# Patient Record
Sex: Female | Born: 1985 | Race: White | Hispanic: No | Marital: Married | Smoking: Never smoker
Health system: Southern US, Community
[De-identification: ages and names within clinical notes are randomized; demographics above are authoritative.]

## PROBLEM LIST (undated history)

## (undated) DIAGNOSIS — F329 Major depressive disorder, single episode, unspecified: Secondary | ICD-10-CM

## (undated) DIAGNOSIS — F32A Depression, unspecified: Secondary | ICD-10-CM

## (undated) DIAGNOSIS — Z8619 Personal history of other infectious and parasitic diseases: Secondary | ICD-10-CM

## (undated) HISTORY — DX: Personal history of other infectious and parasitic diseases: Z86.19

## (undated) HISTORY — PX: BREAST SURGERY: SHX581

## (undated) HISTORY — DX: Depression, unspecified: F32.A

## (undated) HISTORY — PX: TONSILLECTOMY: SUR1361

## (undated) HISTORY — DX: Major depressive disorder, single episode, unspecified: F32.9

---
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
## 2006-03-25 ENCOUNTER — Other Ambulatory Visit: Admission: RE | Admit: 2006-03-25 | Discharge: 2006-03-25 | Payer: Self-pay | Admitting: Internal Medicine

## 2007-04-20 ENCOUNTER — Other Ambulatory Visit: Admission: RE | Admit: 2007-04-20 | Discharge: 2007-04-20 | Payer: Self-pay | Admitting: Family Medicine

## 2008-07-16 ENCOUNTER — Other Ambulatory Visit: Admission: RE | Admit: 2008-07-16 | Discharge: 2008-07-16 | Payer: Self-pay | Admitting: Family Medicine

## 2009-08-14 ENCOUNTER — Other Ambulatory Visit: Admission: RE | Admit: 2009-08-14 | Discharge: 2009-08-14 | Payer: Self-pay | Admitting: Family Medicine

## 2010-07-13 ENCOUNTER — Other Ambulatory Visit: Admission: RE | Admit: 2010-07-13 | Discharge: 2010-07-13 | Payer: Self-pay | Admitting: Family Medicine

## 2011-04-13 LAB — OB RESULTS CONSOLE GBS: GBS: NEGATIVE

## 2011-07-15 ENCOUNTER — Other Ambulatory Visit (HOSPITAL_COMMUNITY)
Admission: RE | Admit: 2011-07-15 | Discharge: 2011-07-15 | Disposition: A | Discharge status: Home / Self Care | Payer: BC Managed Care – PPO | Source: Ambulatory Visit | Attending: Family Medicine | Admitting: Family Medicine

## 2011-07-15 ENCOUNTER — Other Ambulatory Visit: Payer: Self-pay | Admitting: Family Medicine

## 2011-07-15 DIAGNOSIS — Z0000 Encounter for general adult medical examination without abnormal findings: Secondary | ICD-10-CM | POA: Insufficient documentation

## 2011-09-17 LAB — OB RESULTS CONSOLE GC/CHLAMYDIA: Chlamydia: NEGATIVE

## 2011-09-17 LAB — OB RESULTS CONSOLE RPR: RPR: NONREACTIVE

## 2011-09-17 LAB — OB RESULTS CONSOLE ANTIBODY SCREEN: Antibody Screen: NEGATIVE

## 2012-04-12 LAB — OB RESULTS CONSOLE GBS: GBS: NEGATIVE

## 2012-05-05 ENCOUNTER — Encounter (HOSPITAL_COMMUNITY): Payer: Self-pay | Admitting: *Deleted

## 2012-05-05 ENCOUNTER — Telehealth (HOSPITAL_COMMUNITY): Payer: Self-pay | Admitting: *Deleted

## 2012-05-05 NOTE — Telephone Encounter (Signed)
Preadmission screen  

## 2012-05-06 ENCOUNTER — Encounter (HOSPITAL_COMMUNITY): Payer: Self-pay | Admitting: Anesthesiology

## 2012-05-06 ENCOUNTER — Inpatient Hospital Stay (HOSPITAL_COMMUNITY)
Admission: AD | Admit: 2012-05-06 | Discharge: 2012-05-08 | DRG: 373 | Disposition: A | Discharge status: Home / Self Care | Payer: BC Managed Care – PPO | Source: Ambulatory Visit | Attending: Obstetrics and Gynecology | Admitting: Obstetrics and Gynecology

## 2012-05-06 ENCOUNTER — Encounter (HOSPITAL_COMMUNITY): Payer: Self-pay | Admitting: *Deleted

## 2012-05-06 ENCOUNTER — Inpatient Hospital Stay (HOSPITAL_COMMUNITY): Payer: BC Managed Care – PPO | Admitting: Anesthesiology

## 2012-05-06 ENCOUNTER — Encounter (HOSPITAL_COMMUNITY): Payer: Self-pay | Admitting: Obstetrics and Gynecology

## 2012-05-06 LAB — PROTIME-INR
INR: 0.86 (ref 0.00–1.49)
Prothrombin Time: 11.9 seconds (ref 11.6–15.2)

## 2012-05-06 LAB — COMPREHENSIVE METABOLIC PANEL
Alkaline Phosphatase: 199 U/L — ABNORMAL HIGH (ref 39–117)
BUN: 7 mg/dL (ref 6–23)
Chloride: 99 mEq/L (ref 96–112)
GFR calc Af Amer: >90 mL/min (ref >90)
GFR calc non Af Amer: >90 mL/min (ref >90)
Glucose, Bld: 76 mg/dL (ref 70–99)
Potassium: 3.6 mEq/L (ref 3.5–5.1)
Total Bilirubin: 0.2 mg/dL — ABNORMAL LOW (ref 0.3–1.2)
Total Protein: 6.8 g/dL (ref 6.0–8.3)

## 2012-05-06 LAB — CBC
MCH: 30.1 pg (ref 26.0–34.0)
MCV: 88.6 fL (ref 78.0–100.0)
Platelets: 134 10*3/uL — ABNORMAL LOW (ref 150–400)
Platelets: 121 10*3/uL — ABNORMAL LOW (ref 150–400)
RDW: 13.1 % (ref 11.5–15.5)
RDW: 12.9 % (ref 11.5–15.5)
WBC: 17.6 10*3/uL — ABNORMAL HIGH (ref 4.0–10.5)

## 2012-05-06 LAB — APTT: aPTT: 25 seconds (ref 24–37)

## 2012-05-06 MED ORDER — BENZOCAINE-MENTHOL 20-0.5 % EX AERO
1.0000 "application " | INHALATION_SPRAY | CUTANEOUS | Status: DC | PRN
  Filled 2012-05-06: qty 56

## 2012-05-06 MED ORDER — OXYTOCIN 20 UNITS IN LACTATED RINGERS INFUSION - SIMPLE
125.0000 mL/h | Freq: Once | INTRAVENOUS | Status: AC
  Administered 2012-05-06: 125 mL/h via INTRAVENOUS

## 2012-05-06 MED ORDER — IBUPROFEN 600 MG PO TABS
600.0000 mg | ORAL_TABLET | Freq: Four times a day (QID) | ORAL | Status: DC
  Administered 2012-05-06 – 2012-05-08 (×6): 600 mg via ORAL
  Filled 2012-05-06 (×6): qty 1

## 2012-05-06 MED ORDER — BUTORPHANOL TARTRATE 2 MG/ML IJ SOLN: 1.0000 mg | INTRAMUSCULAR | Status: DC | PRN

## 2012-05-06 MED ORDER — PRENATAL MULTIVITAMIN CH
1.0000 | ORAL_TABLET | Freq: Every day | ORAL | Status: DC
  Administered 2012-05-07 – 2012-05-08 (×2): 1 via ORAL
  Filled 2012-05-06 (×2): qty 1

## 2012-05-06 MED ORDER — LACTATED RINGERS IV SOLN
INTRAVENOUS | Status: DC
  Administered 2012-05-06: 09:00:00 via INTRAVENOUS

## 2012-05-06 MED ORDER — ACETAMINOPHEN 325 MG PO TABS: 650.0000 mg | ORAL_TABLET | ORAL | Status: DC | PRN

## 2012-05-06 MED ORDER — MEASLES, MUMPS & RUBELLA VAC ~~LOC~~ INJ: 0.5000 mL | INJECTION | Freq: Once | SUBCUTANEOUS | Status: DC

## 2012-05-06 MED ORDER — OXYCODONE-ACETAMINOPHEN 5-325 MG PO TABS: 1.0000 | ORAL_TABLET | ORAL | Status: DC | PRN

## 2012-05-06 MED ORDER — PHENYLEPHRINE 40 MCG/ML (10ML) SYRINGE FOR IV PUSH (FOR BLOOD PRESSURE SUPPORT)
80.0000 ug | PREFILLED_SYRINGE | INTRAVENOUS | Status: DC | PRN
  Filled 2012-05-06: qty 5

## 2012-05-06 MED ORDER — LANOLIN HYDROUS EX OINT: TOPICAL_OINTMENT | CUTANEOUS | Status: DC | PRN

## 2012-05-06 MED ORDER — EPHEDRINE 5 MG/ML INJ
10.0000 mg | INTRAVENOUS | Status: DC | PRN
  Filled 2012-05-06: qty 4

## 2012-05-06 MED ORDER — ONDANSETRON HCL 4 MG/2ML IJ SOLN: 4.0000 mg | INTRAMUSCULAR | Status: DC | PRN

## 2012-05-06 MED ORDER — SIMETHICONE 80 MG PO CHEW: 80.0000 mg | CHEWABLE_TABLET | ORAL | Status: DC | PRN

## 2012-05-06 MED ORDER — ZOLPIDEM TARTRATE 5 MG PO TABS: 5.0000 mg | ORAL_TABLET | Freq: Every evening | ORAL | Status: DC | PRN

## 2012-05-06 MED ORDER — FENTANYL 2.5 MCG/ML BUPIVACAINE 1/10 % EPIDURAL INFUSION (WH - ANES)
14.0000 mL/h | INTRAMUSCULAR | Status: DC
  Administered 2012-05-06: 14 mL/h via EPIDURAL
  Filled 2012-05-06 (×2): qty 60

## 2012-05-06 MED ORDER — FENTANYL 2.5 MCG/ML BUPIVACAINE 1/10 % EPIDURAL INFUSION (WH - ANES)
INTRAMUSCULAR | Status: DC | PRN
  Administered 2012-05-06: 14 mL/h via EPIDURAL

## 2012-05-06 MED ORDER — DIPHENHYDRAMINE HCL 50 MG/ML IJ SOLN: 12.5000 mg | INTRAMUSCULAR | Status: DC | PRN

## 2012-05-06 MED ORDER — TETANUS-DIPHTH-ACELL PERTUSSIS 5-2.5-18.5 LF-MCG/0.5 IM SUSP: 0.5000 mL | Freq: Once | INTRAMUSCULAR | Status: DC

## 2012-05-06 MED ORDER — ONDANSETRON HCL 4 MG/2ML IJ SOLN: 4.0000 mg | Freq: Four times a day (QID) | INTRAMUSCULAR | Status: DC | PRN

## 2012-05-06 MED ORDER — EPHEDRINE 5 MG/ML INJ: 10.0000 mg | INTRAVENOUS | Status: DC | PRN

## 2012-05-06 MED ORDER — CITRIC ACID-SODIUM CITRATE 334-500 MG/5ML PO SOLN: 30.0000 mL | ORAL | Status: DC | PRN

## 2012-05-06 MED ORDER — LACTATED RINGERS IV SOLN: 500.0000 mL | Freq: Once | INTRAVENOUS | Status: DC

## 2012-05-06 MED ORDER — DIPHENHYDRAMINE HCL 25 MG PO CAPS: 25.0000 mg | ORAL_CAPSULE | Freq: Four times a day (QID) | ORAL | Status: DC | PRN

## 2012-05-06 MED ORDER — FLEET ENEMA 7-19 GM/118ML RE ENEM: 1.0000 | ENEMA | RECTAL | Status: DC | PRN

## 2012-05-06 MED ORDER — SENNOSIDES-DOCUSATE SODIUM 8.6-50 MG PO TABS
2.0000 | ORAL_TABLET | Freq: Every day | ORAL | Status: DC
  Administered 2012-05-06 – 2012-05-07 (×2): 2 via ORAL

## 2012-05-06 MED ORDER — MEDROXYPROGESTERONE ACETATE 150 MG/ML IM SUSP: 150.0000 mg | INTRAMUSCULAR | Status: DC | PRN

## 2012-05-06 MED ORDER — ONDANSETRON HCL 4 MG PO TABS: 4.0000 mg | ORAL_TABLET | ORAL | Status: DC | PRN

## 2012-05-06 MED ORDER — LIDOCAINE HCL (PF) 1 % IJ SOLN
INTRAMUSCULAR | Status: DC | PRN
  Administered 2012-05-06: 8 mL
  Administered 2012-05-06: 9 mL

## 2012-05-06 MED ORDER — IBUPROFEN 600 MG PO TABS
600.0000 mg | ORAL_TABLET | Freq: Four times a day (QID) | ORAL | Status: DC | PRN
  Administered 2012-05-06: 600 mg via ORAL
  Filled 2012-05-06: qty 1

## 2012-05-06 MED ORDER — OXYTOCIN BOLUS FROM INFUSION
500.0000 mL | Freq: Once | INTRAVENOUS | Status: DC
  Filled 2012-05-06: qty 500
  Filled 2012-05-06: qty 1000

## 2012-05-06 MED ORDER — PHENYLEPHRINE 40 MCG/ML (10ML) SYRINGE FOR IV PUSH (FOR BLOOD PRESSURE SUPPORT): 80.0000 ug | PREFILLED_SYRINGE | INTRAVENOUS | Status: DC | PRN

## 2012-05-06 MED ORDER — LIDOCAINE HCL (PF) 1 % IJ SOLN
30.0000 mL | INTRAMUSCULAR | Status: DC | PRN
  Filled 2012-05-06: qty 30

## 2012-05-06 MED ORDER — DIBUCAINE 1 % RE OINT: 1.0000 "application " | TOPICAL_OINTMENT | RECTAL | Status: DC | PRN

## 2012-05-06 MED ORDER — LACTATED RINGERS IV SOLN: 500.0000 mL | INTRAVENOUS | Status: DC | PRN

## 2012-05-06 MED ORDER — WITCH HAZEL-GLYCERIN EX PADS: 1.0000 "application " | MEDICATED_PAD | CUTANEOUS | Status: DC | PRN

## 2012-05-06 NOTE — Progress Notes (Signed)
Dr. Chilton Si notified of patient arrival in labor. Contractions every 2 mins. Cervix 5,70,-2 bulging bag. Pt wanting an epidural. Orders received to discharge patient home.

## 2012-05-06 NOTE — H&P (Signed)
Emma Coleman is a 26 y.o. female presenting  At term in labor. Prenatal course has been uncomplicated.   Past Medical History  Diagnosis Date  . Depression   . History of chicken pox    Past Surgical History  Procedure Date  . Breast surgery     implants   Family History: family history includes ALS in her maternal uncle; Cancer in her father, paternal grandfather, and paternal grandmother; Depression in her father and mother; Heart attack in her father and maternal grandfather; Hyperlipidemia in her father, maternal grandmother, and mother; and Hypertension in her father and mother. Social History:  reports that she has never smoked. She has never used smokeless tobacco. She reports that she does not drink alcohol or use illicit drugs.  ROS noncontributory  Dilation: 10 Effacement (%): 100 Station: +2 Exam by:: dr Neva Seat Blood pressure 124/84, pulse 74, temperature 98.6 F (37 C), temperature source Oral, resp. rate 18, height 5\' 4"  (1.626 m), weight 72.122 kg (159 lb), last menstrual period 07/29/2011, SpO2 99.00%, unknown if currently breastfeeding. Phys Exam:  HEENT nl Chest clear Heart S1and S2 clear abd term gravida Ext nl cx C/C/+1  ( 5 cm on admission) Prenatal labs: ABO, Rh: B/Positive/-- (10/19 0000) Antibody: Negative (10/19 0000) Rubella:   RPR: NON REACTIVE (06/08 0850)  HBsAg: Negative (10/19 0000)  HIV: Non-reactive (10/19 0000)  GBS: Negative (05/15 0000)   Assessment/Plan: Term pregnancy, labor  Expectant care   Emma Coleman E 05/06/2012, 5:54 PM

## 2012-05-06 NOTE — Anesthesia Preprocedure Evaluation (Signed)
Anesthesia Evaluation  Patient identified by MRN, date of birth, ID band Patient awake    Reviewed: Allergy & Precautions, H&P , NPO status , Patient's Chart, lab work & pertinent test results  Airway Mallampati: I TM Distance: >3 FB Neck ROM: full    Dental No notable dental hx.    Pulmonary neg pulmonary ROS,    Pulmonary exam normal       Cardiovascular negative cardio ROS      Neuro/Psych PSYCHIATRIC DISORDERS Depression negative neurological ROS     GI/Hepatic negative GI ROS, Neg liver ROS,   Endo/Other  negative endocrine ROS  Renal/GU negative Renal ROS  negative genitourinary   Musculoskeletal negative musculoskeletal ROS (+)   Abdominal Normal abdominal exam  (+)   Peds negative pediatric ROS (+)  Hematology negative hematology ROS (+)   Anesthesia Other Findings   Reproductive/Obstetrics (+) Pregnancy                           Anesthesia Physical Anesthesia Plan  ASA: II  Anesthesia Plan: Epidural   Post-op Pain Management:    Induction:   Airway Management Planned:   Additional Equipment:   Intra-op Plan:   Post-operative Plan:   Informed Consent: I have reviewed the patients History and Physical, chart, labs and discussed the procedure including the risks, benefits and alternatives for the proposed anesthesia with the patient or authorized representative who has indicated his/her understanding and acceptance.     Plan Discussed with:   Anesthesia Plan Comments:         Anesthesia Quick Evaluation  

## 2012-05-06 NOTE — Anesthesia Procedure Notes (Signed)
Epidural Patient location during procedure: OB Start time: 05/06/2012 10:20 AM End time: 05/06/2012 10:26 AM Reason for block: procedure for pain  Staffing Anesthesiologist: Sandrea Hughs Performed by: anesthesiologist   Preanesthetic Checklist Completed: patient identified, site marked, surgical consent, pre-op evaluation, timeout performed, IV checked, risks and benefits discussed and monitors and equipment checked  Epidural Patient position: sitting Prep: site prepped and draped and DuraPrep Patient monitoring: continuous pulse ox and blood pressure Approach: midline Injection technique: LOR air  Needle:  Needle type: Tuohy  Needle gauge: 17 G Needle length: 9 cm Needle insertion depth: 6 cm Catheter type: closed end flexible Catheter size: 19 Gauge Catheter at skin depth: 11 cm Test dose: negative and Other  Assessment Sensory level: T10 Events: blood not aspirated, injection not painful, no injection resistance, negative IV test and no paresthesia

## 2012-05-06 NOTE — Progress Notes (Signed)
Delivery Note At 3:16 PM a viable female was delivered via Vaginal, Spontaneous Delivery (Presentation: ; Occiput Anterior).  APGAR: 9, 9; weight 6 lb 7.9 oz (2945 g).  Nuchal cord x1 Placenta status: Intact, Spontaneous.  Cord: 3 vessels with the following complications: None.  Cord pH: na  Anesthesia: Epidural  Episiotomy: Median Lacerations: 2nd degree Suture Repair: 2.0 chromic Est. Blood Loss (mL): 500  Mom to postpartum.  Baby to nursery-stable.  Reshaun Briseno E 05/06/2012, 6:00 PM

## 2012-05-06 NOTE — MAU Note (Signed)
Pt presents to MAU with chief complaint of contractions. Pt is [redacted]w[redacted]d; G1 was seen in the office yesterday and her cervix was 4 cm. Pt says contractions started this morning at 0430 and have been consistant throughout the morning.

## 2012-05-07 LAB — CBC
MCHC: 33.1 g/dL (ref 30.0–36.0)
MCV: 89.3 fL (ref 78.0–100.0)
Platelets: 106 10*3/uL — ABNORMAL LOW (ref 150–400)
RDW: 13.2 % (ref 11.5–15.5)
WBC: 17.4 10*3/uL — ABNORMAL HIGH (ref 4.0–10.5)

## 2012-05-07 MED ORDER — FERROUS SULFATE 300 (60 FE) MG/5ML PO SYRP
300.0000 mg | ORAL_SOLUTION | Freq: Two times a day (BID) | ORAL | Status: DC
  Filled 2012-05-07: qty 5

## 2012-05-07 NOTE — Progress Notes (Signed)
Patient was referred for history of depression/anxiety. Referral screened out by Clinical Social Worker because prenatal record indicates depression occurred around age [age] years of age.  Please contact the Clinical Social Worker if needs arise, or by the patient's request. Staci Acosta, MSW LCSW, 05/07/2012, 8:17 am

## 2012-05-07 NOTE — Progress Notes (Signed)
Post Partum Day 1   Subjective: no complaints.   No signs, symptoms of PIH  Objective: Blood pressure 127/84, pulse 93, temperature 98.2 F (36.8 C), temperature source Oral, resp. rate 18, height 5\' 4"  (1.626 m), weight 72.122 kg (159 lb), last menstrual period 07/29/2011, SpO2 99.00%, unknown if currently breastfeeding. Admission BPs were slightly elevated, but normal now. Platelets on admission 134,000.  106,000 today.  Other PIH labs were wnl 05/06/12 except  Alk phos elevated at 199.  Physical Exam:  General: alert, cooperative and no distress Lochia: appropriate Uterine Fundus: firm Episiotomy, laceration : no significant drainage DVT Evaluation: No evidence of DVT seen on physical exam.   Basename 05/07/12 0601 05/06/12 1410  HGB 10.2* 13.6  HCT 30.8* 40.3    Assessment/Plan: Plan for discharge tomorrow Iron supplementation   LOS: 1 day   Radek Carnero E 05/07/2012, 8:52 PM

## 2012-05-07 NOTE — Anesthesia Postprocedure Evaluation (Signed)
Anesthesia Post Note  Patient: Emma Coleman  Procedure(s) Performed: * No procedures listed *  Anesthesia type: Epidural  Patient location: Mother/Baby  Post pain: Pain level controlled  Post assessment: Post-op Vital signs reviewed  Last Vitals:  Filed Vitals:   05/07/12 1452  BP: 127/84  Pulse: 93  Temp: 36.8 C  Resp: 18    Post vital signs: Reviewed  Level of consciousness: awake  Complications: No apparent anesthesia complications

## 2012-05-07 NOTE — Addendum Note (Signed)
Addendum  created 05/07/12 2051 by Sandrea Hughs., MD   Modules edited:Notes Section

## 2012-05-07 NOTE — Anesthesia Postprocedure Evaluation (Deleted)
  Anesthesia Post-op Note  Patient: Emma Coleman  Procedure(s) Performed: * No procedures listed *  Patient Location: Mother/Baby  Anesthesia Type: Epidural  Level of Consciousness: awake, alert  and oriented  Airway and Oxygen Therapy: Patient Spontanous Breathing  Post-op Pain: mild  Post-op Assessment: Patient's Cardiovascular Status Stable and Respiratory Function Stable  Post-op Vital Signs: stable  Complications: No apparent anesthesia complications

## 2012-05-08 MED ORDER — OXYCODONE-ACETAMINOPHEN 5-325 MG PO TABS: 1.0000 | ORAL_TABLET | Freq: Four times a day (QID) | ORAL | Status: AC | PRN

## 2012-05-08 MED ORDER — IBUPROFEN 600 MG PO TABS: 600.0000 mg | ORAL_TABLET | Freq: Four times a day (QID) | ORAL | Status: AC | PRN

## 2012-05-08 NOTE — Discharge Summary (Signed)
Obstetric Discharge Summary Reason for Admission: onset of labor Prenatal Procedures: none Intrapartum Procedures: spontaneous vaginal delivery Postpartum Procedures: none Complications-Operative and Postpartum: 2nd degree perineal laceration Hemoglobin  Date Value Range Status  05/07/2012 10.2* 12.0-15.0 (g/dL) Final     DELTA CHECK NOTED     REPEATED TO VERIFY     HCT  Date Value Range Status  05/07/2012 30.8* 36.0-46.0 (%) Final    Physical Exam:  General: alert and cooperative Lochia: appropriate Uterine Fundus: firm Incision: NA DVT Evaluation: No evidence of DVT seen on physical exam.  Discharge Diagnoses: Term Pregnancy-delivered  Discharge Information: Date: 05/08/2012 Activity: pelvic rest Diet: routine Medications: PNV, Ibuprofen and Percocet Condition: stable Instructions: refer to practice specific booklet Discharge to: home Follow-up Information    Follow up with Emma Hebenstreit J., MD in 2 weeks. (postpartum visit )    Contact information:   [address] [place] [address] [phone]          Newborn Data: Live born female  Birth Weight: 6 lb 7.9 oz (2945 g) APGAR: 9, 9  Home with mother.  Emma Campas J. 05/08/2012, 8:20 AM

## 2012-05-08 NOTE — Progress Notes (Signed)
Post Partum Day 1 s/p vaginal delivery  Subjective: no complaints, up ad lib, voiding and tolerating PO  Objective: Blood pressure 127/86, pulse 74, temperature 98.1 F (36.7 C), temperature source Oral, resp. rate 18, height 5\' 4"  (1.626 m), weight 72.122 kg (159 lb), last menstrual period 07/29/2011, SpO2 99.00%, unknown if currently breastfeeding.  Physical Exam:  General: alert and cooperative Lochia: appropriate Uterine Fundus: firm Incision: NA DVT Evaluation: No evidence of DVT seen on physical exam.   Basename 05/07/12 0601 05/06/12 1410  HGB 10.2* 13.6  HCT 30.8* 40.3    Assessment/Plan: Discharge home, Breastfeeding and Lactation consult   LOS: 2 days   Nickoli Bagheri J. 05/08/2012, 8:17 AM

## 2012-05-15 ENCOUNTER — Inpatient Hospital Stay (HOSPITAL_COMMUNITY): Admission: RE | Admit: 2012-05-15 | Payer: BC Managed Care – PPO | Source: Ambulatory Visit

## 2012-05-16 ENCOUNTER — Ambulatory Visit (HOSPITAL_COMMUNITY): Payer: BC Managed Care – PPO

## 2012-11-10 ENCOUNTER — Other Ambulatory Visit: Payer: Self-pay | Admitting: Otolaryngology

## 2013-02-05 ENCOUNTER — Other Ambulatory Visit: Payer: Self-pay | Admitting: Obstetrics and Gynecology

## 2013-02-05 ENCOUNTER — Other Ambulatory Visit (HOSPITAL_COMMUNITY)
Admission: RE | Admit: 2013-02-05 | Discharge: 2013-02-05 | Disposition: A | Discharge status: Home / Self Care | Payer: BC Managed Care – PPO | Source: Ambulatory Visit | Attending: Obstetrics and Gynecology | Admitting: Obstetrics and Gynecology

## 2013-02-05 DIAGNOSIS — Z01419 Encounter for gynecological examination (general) (routine) without abnormal findings: Secondary | ICD-10-CM | POA: Insufficient documentation

## 2013-02-05 DIAGNOSIS — Z1151 Encounter for screening for human papillomavirus (HPV): Secondary | ICD-10-CM | POA: Insufficient documentation

## 2013-04-09 ENCOUNTER — Other Ambulatory Visit: Payer: Self-pay | Admitting: Dermatology

## 2014-07-24 ENCOUNTER — Other Ambulatory Visit: Payer: Self-pay | Admitting: Obstetrics and Gynecology

## 2014-07-24 ENCOUNTER — Other Ambulatory Visit (HOSPITAL_COMMUNITY)
Admission: RE | Admit: 2014-07-24 | Discharge: 2014-07-24 | Disposition: A | Discharge status: Home / Self Care | Payer: BC Managed Care – PPO | Source: Ambulatory Visit | Attending: Obstetrics and Gynecology | Admitting: Obstetrics and Gynecology

## 2014-07-24 DIAGNOSIS — Z01419 Encounter for gynecological examination (general) (routine) without abnormal findings: Secondary | ICD-10-CM | POA: Insufficient documentation

## 2014-07-26 LAB — CYTOLOGY - PAP

## 2014-09-30 ENCOUNTER — Encounter (HOSPITAL_COMMUNITY): Payer: Self-pay | Admitting: *Deleted

## 2014-11-29 NOTE — L&D Delivery Note (Signed)
Delivery Note At 7:47 AM a viable female was delivered via Vaginal, Spontaneous Delivery (Presentation: ; Occiput Anterior).  APGAR: 9, 9; weight 7 lb 5.5 oz (3331 g).   Placenta status: Intact, Spontaneous.  Cord: 3 vessels with the following complications: None.  Anesthesia: Pudendal  Episiotomy: None Lacerations: 3rd degree Suture Repair: 2.0 3.0 vicryl Est. Blood Loss (mL): 375  Mom to postpartum.  Baby to Couplet care / Skin to Skin.  Myna HidalgoZAN, Hadden Steig, M 09/25/2015, 9:07 AM

## 2015-02-03 LAB — OB RESULTS CONSOLE RPR: RPR: NONREACTIVE

## 2015-02-03 LAB — OB RESULTS CONSOLE ABO/RH: RH Type: POSITIVE

## 2015-02-03 LAB — OB RESULTS CONSOLE RUBELLA ANTIBODY, IGM: Rubella: IMMUNE

## 2015-02-03 LAB — OB RESULTS CONSOLE ANTIBODY SCREEN: Antibody Screen: NEGATIVE

## 2015-02-03 LAB — OB RESULTS CONSOLE HEPATITIS B SURFACE ANTIGEN: Hepatitis B Surface Ag: NEGATIVE

## 2015-02-03 LAB — OB RESULTS CONSOLE HIV ANTIBODY (ROUTINE TESTING): HIV: NONREACTIVE

## 2015-02-25 LAB — OB RESULTS CONSOLE GC/CHLAMYDIA
Chlamydia: NEGATIVE
GC PROBE AMP, GENITAL: NEGATIVE

## 2015-09-25 ENCOUNTER — Inpatient Hospital Stay (HOSPITAL_COMMUNITY)
Admission: AD | Admit: 2015-09-25 | Discharge: 2015-09-26 | DRG: 775 | Disposition: A | Discharge status: Home / Self Care | Payer: BLUE CROSS/BLUE SHIELD | Source: Ambulatory Visit | Attending: Obstetrics and Gynecology | Admitting: Obstetrics and Gynecology

## 2015-09-25 ENCOUNTER — Encounter (HOSPITAL_COMMUNITY): Payer: Self-pay

## 2015-09-25 DIAGNOSIS — Z3A39 39 weeks gestation of pregnancy: Secondary | ICD-10-CM | POA: Diagnosis not present

## 2015-09-25 DIAGNOSIS — O26893 Other specified pregnancy related conditions, third trimester: Secondary | ICD-10-CM | POA: Diagnosis present

## 2015-09-25 LAB — TYPE AND SCREEN
ABO/RH(D): B POS
Antibody Screen: NEGATIVE

## 2015-09-25 LAB — CBC
HEMATOCRIT: 35.1 % — AB (ref 36.0–46.0)
HEMOGLOBIN: 12.1 g/dL (ref 12.0–15.0)
MCH: 30.6 pg (ref 26.0–34.0)
MCHC: 34.5 g/dL (ref 30.0–36.0)
MCV: 88.6 fL (ref 78.0–100.0)
Platelets: 131 10*3/uL — ABNORMAL LOW (ref 150–400)
RBC: 3.96 MIL/uL (ref 3.87–5.11)
RDW: 13.6 % (ref 11.5–15.5)
WBC: 13.1 10*3/uL — ABNORMAL HIGH (ref 4.0–10.5)

## 2015-09-25 LAB — ABO/RH: ABO/RH(D): B POS

## 2015-09-25 LAB — RPR: RPR Ser Ql: NONREACTIVE

## 2015-09-25 LAB — POCT FERN TEST: POCT Fern Test: POSITIVE

## 2015-09-25 MED ORDER — ONDANSETRON HCL 4 MG/2ML IJ SOLN: 4.0000 mg | INTRAMUSCULAR | Status: DC | PRN

## 2015-09-25 MED ORDER — LACTATED RINGERS IV SOLN: 500.0000 mL | INTRAVENOUS | Status: DC | PRN

## 2015-09-25 MED ORDER — OXYCODONE-ACETAMINOPHEN 5-325 MG PO TABS: 2.0000 | ORAL_TABLET | ORAL | Status: DC | PRN

## 2015-09-25 MED ORDER — LANOLIN HYDROUS EX OINT: TOPICAL_OINTMENT | CUTANEOUS | Status: DC | PRN

## 2015-09-25 MED ORDER — OXYCODONE-ACETAMINOPHEN 5-325 MG PO TABS: 1.0000 | ORAL_TABLET | ORAL | Status: DC | PRN

## 2015-09-25 MED ORDER — SIMETHICONE 80 MG PO CHEW: 80.0000 mg | CHEWABLE_TABLET | ORAL | Status: DC | PRN

## 2015-09-25 MED ORDER — CITRIC ACID-SODIUM CITRATE 334-500 MG/5ML PO SOLN: 30.0000 mL | ORAL | Status: DC | PRN

## 2015-09-25 MED ORDER — LIDOCAINE HCL 1 % IJ SOLN
10.0000 mg | Freq: Once | INTRAMUSCULAR | Status: DC
  Filled 2015-09-25: qty 1

## 2015-09-25 MED ORDER — ACETAMINOPHEN 325 MG PO TABS: 650.0000 mg | ORAL_TABLET | ORAL | Status: DC | PRN

## 2015-09-25 MED ORDER — ONDANSETRON HCL 4 MG PO TABS: 4.0000 mg | ORAL_TABLET | ORAL | Status: DC | PRN

## 2015-09-25 MED ORDER — LACTATED RINGERS IV SOLN: INTRAVENOUS | Status: DC

## 2015-09-25 MED ORDER — PHENYLEPHRINE 40 MCG/ML (10ML) SYRINGE FOR IV PUSH (FOR BLOOD PRESSURE SUPPORT): 80.0000 ug | PREFILLED_SYRINGE | INTRAVENOUS | Status: DC | PRN

## 2015-09-25 MED ORDER — LIDOCAINE HCL (PF) 1 % IJ SOLN
30.0000 mL | INTRAMUSCULAR | Status: AC | PRN
  Administered 2015-09-25 (×2): 30 mL via SUBCUTANEOUS
  Filled 2015-09-25 (×2): qty 30

## 2015-09-25 MED ORDER — PRENATAL MULTIVITAMIN CH
1.0000 | ORAL_TABLET | Freq: Every day | ORAL | Status: DC
  Administered 2015-09-25: 1 via ORAL
  Filled 2015-09-25: qty 1

## 2015-09-25 MED ORDER — OXYTOCIN 40 UNITS IN LACTATED RINGERS INFUSION - SIMPLE MED
62.5000 mL/h | INTRAVENOUS | Status: DC
  Administered 2015-09-25: 62.5 mL/h via INTRAVENOUS
  Filled 2015-09-25: qty 1000

## 2015-09-25 MED ORDER — WITCH HAZEL-GLYCERIN EX PADS: 1.0000 "application " | MEDICATED_PAD | CUTANEOUS | Status: DC | PRN

## 2015-09-25 MED ORDER — DIBUCAINE 1 % RE OINT
1.0000 | TOPICAL_OINTMENT | RECTAL | Status: DC | PRN
Start: 2015-09-25 — End: 2015-09-26

## 2015-09-25 MED ORDER — IBUPROFEN 600 MG PO TABS
600.0000 mg | ORAL_TABLET | Freq: Four times a day (QID) | ORAL | Status: DC
  Administered 2015-09-25 – 2015-09-26 (×4): 600 mg via ORAL
  Filled 2015-09-25 (×4): qty 1

## 2015-09-25 MED ORDER — EPHEDRINE 5 MG/ML INJ
10.0000 mg | INTRAVENOUS | Status: DC | PRN
Start: 2015-09-25 — End: 2015-09-25

## 2015-09-25 MED ORDER — ZOLPIDEM TARTRATE 5 MG PO TABS: 5.0000 mg | ORAL_TABLET | Freq: Every evening | ORAL | Status: DC | PRN

## 2015-09-25 MED ORDER — FLEET ENEMA 7-19 GM/118ML RE ENEM: 1.0000 | ENEMA | RECTAL | Status: DC | PRN

## 2015-09-25 MED ORDER — DIPHENHYDRAMINE HCL 50 MG/ML IJ SOLN: 12.5000 mg | INTRAMUSCULAR | Status: DC | PRN

## 2015-09-25 MED ORDER — BENZOCAINE-MENTHOL 20-0.5 % EX AERO
1.0000 "application " | INHALATION_SPRAY | CUTANEOUS | Status: DC | PRN
  Administered 2015-09-25: 1 via TOPICAL
  Filled 2015-09-25: qty 56

## 2015-09-25 MED ORDER — DIPHENHYDRAMINE HCL 25 MG PO CAPS: 25.0000 mg | ORAL_CAPSULE | Freq: Four times a day (QID) | ORAL | Status: DC | PRN

## 2015-09-25 MED ORDER — CEFAZOLIN SODIUM-DEXTROSE 2-3 GM-% IV SOLR
2.0000 g | Freq: Once | INTRAVENOUS | Status: AC
  Administered 2015-09-25: 2 g via INTRAVENOUS
  Filled 2015-09-25: qty 50

## 2015-09-25 MED ORDER — SENNOSIDES-DOCUSATE SODIUM 8.6-50 MG PO TABS
2.0000 | ORAL_TABLET | Freq: Two times a day (BID) | ORAL | Status: DC
  Administered 2015-09-25: 2 via ORAL
  Filled 2015-09-25: qty 2

## 2015-09-25 MED ORDER — FENTANYL 2.5 MCG/ML BUPIVACAINE 1/10 % EPIDURAL INFUSION (WH - ANES): 14.0000 mL/h | INTRAMUSCULAR | Status: DC | PRN

## 2015-09-25 MED ORDER — OXYTOCIN BOLUS FROM INFUSION
500.0000 mL | INTRAVENOUS | Status: DC
Start: 2015-09-25 — End: 2015-09-25

## 2015-09-25 MED ORDER — ONDANSETRON HCL 4 MG/2ML IJ SOLN: 4.0000 mg | Freq: Four times a day (QID) | INTRAMUSCULAR | Status: DC | PRN

## 2015-09-25 NOTE — MAU Note (Signed)
Pt states ROM at 0315-pink fluid. Occasional contractions. +FM. Was 1.5cm yesterday.

## 2015-09-25 NOTE — H&P (Signed)
HPI: 29 y/o G2P1001 @ 4474w2d estimated gestational age presents complaining of rupture of membranes around 3am.   + Leaking of Fluid,   no Vaginal Bleeding,   Now feeling Uterine Contractions,  + Fetal Movement.  ROS: no HA, no epigastric pain, no visual changes.    Pregnancy uncomplicated   Prenatal Transfer Tool  Maternal Diabetes: No Genetic Screening: Normal Maternal Ultrasounds/Referrals: Normal Fetal Ultrasounds or other Referrals:  None Maternal Substance Abuse:  No Significant Maternal Medications:  None Significant Maternal Lab Results: Lab values include: Group B Strep negative   PNL:  GBS neg, Rub Immune, Hep B neg, RPR NR, HIV neg, GC/C neg, glucola:normal Hgb: 11.7 Blood type: B pos  OBHx: 2013: FTNSVD, 6#7oz uncomplicated PMHx:  none Meds:  PNV,  Allergy:  No Known Allergies SurgHx: none SocHx:   no Tobacco, no  EtOH, no Illicit Drugs  O: BP 131/86 mmHg  Pulse 67  Temp(Src) 97.9 F (36.6 C) (Oral)  Resp 16  Ht 5\' 4"  (1.626 m)  Wt 70.035 kg (154 lb 6.4 oz)  BMI 26.49 kg/m2  SpO2 98% Gen. AAOx3, NAD CV.  RRR  No murmur.  Resp. CTAB, no wheeze or crackles. Abd. Gravid,  no tenderness,  no rigidity,  no guarding Extr.  no edema B/L , no calf tenderness  FHT: 145 baseline, moderte variability, + accels,  no decels Toco: q 3 min SVE: 3/80/-3 in MAU, confirmed rupture vertex on exam (exam per RN)   Labs: see orders  A/P:  29 y.o. G2P1001 @ 3074w2d EGA who presents for SROM -FWB:  NICHD Cat I FHTs -Labor: expectant management -GBS: neg  Myna HidalgoJennifer Alyanah Elliott, DO [phone] (pager) [phone] (office)

## 2015-09-25 NOTE — Progress Notes (Signed)

## 2015-09-26 LAB — CBC
HEMATOCRIT: 31.1 % — AB (ref 36.0–46.0)
Hemoglobin: 10.4 g/dL — ABNORMAL LOW (ref 12.0–15.0)
MCH: 29.8 pg (ref 26.0–34.0)
MCHC: 33.4 g/dL (ref 30.0–36.0)
MCV: 89.1 fL (ref 78.0–100.0)
Platelets: 116 10*3/uL — ABNORMAL LOW (ref 150–400)
RBC: 3.49 MIL/uL — AB (ref 3.87–5.11)
RDW: 13.5 % (ref 11.5–15.5)
WBC: 16.4 10*3/uL — AB (ref 4.0–10.5)

## 2015-09-26 MED ORDER — IBUPROFEN 600 MG PO TABS: 600.0000 mg | ORAL_TABLET | Freq: Four times a day (QID) | ORAL | Status: AC | PRN

## 2015-09-26 MED ORDER — SENNOSIDES-DOCUSATE SODIUM 8.6-50 MG PO TABS: 2.0000 | ORAL_TABLET | Freq: Two times a day (BID) | ORAL | Status: AC

## 2015-09-26 NOTE — Discharge Instructions (Signed)

## 2015-09-26 NOTE — Progress Notes (Signed)
Postpartum Note Day # 1  S:  Patient resting comfortable in bed.  Pain controlled.  Tolerating general. + flatus, no BM.  Lochia moderate.  Ambulating without difficulty.  She denies n/v/f/c, SOB, or CP.  Pt plans on breastfeeding.  O: Temp:  [97.9 F (36.6 C)-98.5 F (36.9 C)] 98.3 F (36.8 C) (10/28 0527) Pulse Rate:  [67-94] 78 (10/28 0527) Resp:  [16-18] 18 (10/28 0527) BP: (111-131)/(72-86) 115/75 mmHg (10/28 0527) SpO2:  [97 %] 97 % (10/27 2141) Gen: A&Ox3, NAD CV: RRR, no MRG Resp: CTAB Abdomen: soft, NT, ND Uterus: firm, non-tender, below umbilicus Ext: No edema, no calf tenderness bilaterally  Labs:  CBC Latest Ref Rng 09/26/2015 09/25/2015 05/07/2012  WBC 4.0 - 10.5 K/uL 16.4(H) 13.1(H) 17.4(H)  Hemoglobin 12.0 - 15.0 g/dL 10.4(L) 12.1 10.2(L)  Hematocrit 36.0 - 46.0 % 31.1(L) 35.1(L) 30.8(L)  Platelets 150 - 400 K/uL PENDING 131(L) 106(L)    A/P: Pt is a 29 y.o. G2P2002 s/p NSVD, PPD#1  - Pain well controlled -GU: UOP is adequate -GI: Tolerating general diet -Activity: encouraged sitting up to chair and ambulation as tolerated -Prophylaxis: early ambulation -Labs: appropriate decline due to recent delivery  Meeting postpartum milestones appropriately, possible discharge home later today   Myna HidalgoJennifer Berdene Askari, DO [phone] (pager) [phone] (office)

## 2015-10-03 NOTE — Discharge Summary (Signed)
OB Discharge Summary     Patient Name: Emma BrownsStacey Levier DOB: 1986/07/26 MRN: [id number]  Date of admission: 09/25/2015 Delivering MD: Myna HidalgoZAN, Anays Detore   Date of discharge: 10/03/2015  Admitting diagnosis: 39 WEEKS CTX Intrauterine pregnancy: 606w2d     Secondary diagnosis:  Active Problems:   Normal labor  Additional problems: none     Discharge diagnosis: Term Pregnancy Delivered                                                                                                Post partum procedures:none  Augmentation: none  Complications: None  Hospital course:  Onset of Labor With Vaginal Delivery     29 y.o. yo G2P2002 at 746w2d was admitted in Latent Laboron 09/25/2015. Patient had an uncomplicated labor course as follows:  Membrane Rupture Time/Date: 3:15 AM ,09/25/2015   Intrapartum Procedures: Episiotomy: None [1]                                         Lacerations:  3rd degree [4]  Patient had a delivery of a Viable infant. 09/25/2015  Information for the patient's newborn:  Marin CommentGreen, Avery Lynn [[id number]][[id number]]  Delivery Method: Vaginal, Spontaneous Delivery (Filed from Delivery Summary)    Pateint had an uncomplicated postpartum course.  She is ambulating, tolerating a regular diet, passing flatus, and urinating well. Patient is discharged home in stable condition on 09/26/2015 11:20 AM.    Physical exam  Filed Vitals:   09/25/15 1050 09/25/15 1512 09/25/15 2141 09/26/15 0527  BP: 117/79 120/82 111/78 115/75  Pulse: 88 79 94 78  Temp: 98.5 F (36.9 C) 98.4 F (36.9 C) 98 F (36.7 C) 98.3 F (36.8 C)  TempSrc: Oral Oral Oral Oral  Resp: 18 18 18 18   Height:      Weight:      SpO2:   97%    General: alert, cooperative and no distress Lochia: appropriate Uterine Fundus: firm Incision: N/A DVT Evaluation: No evidence of DVT seen on physical exam. Labs: Lab Results  Component Value Date   WBC 16.4* 09/26/2015   HGB 10.4* 09/26/2015   HCT 31.1* 09/26/2015   MCV  89.1 09/26/2015   PLT 116* 09/26/2015   CMP Latest Ref Rng 05/06/2012  Glucose 70 - 99 mg/dL 76  BUN 6 - 23 mg/dL 7  Creatinine 1.440.50 - 3.151.10 mg/dL 4.000.64  Sodium 867135 - 619145 mEq/L 134(L)  Potassium 3.5 - 5.1 mEq/L 3.6  Chloride 96 - 112 mEq/L 99  CO2 19 - 32 mEq/L 23  Calcium 8.4 - 10.5 mg/dL 8.5  Total Protein 6.0 - 8.3 g/dL 6.8  Total Bilirubin 0.3 - 1.2 mg/dL 5.0(D0.2(L)  Alkaline Phos 39 - 117 U/L 199(H)  AST 0 - 37 U/L 17  ALT 0 - 35 U/L 14    Discharge instruction: per After Visit Summary and "Baby and Me Booklet".  Medications:MEDICATIONS: See list below After visit meds:    Medication List    TAKE these medications  calcium carbonate 500 MG chewable tablet  Commonly known as:  TUMS - dosed in mg elemental calcium  Chew 2 tablets by mouth daily as needed for indigestion or heartburn. heartburn     ibuprofen 600 MG tablet  Commonly known as:  ADVIL,MOTRIN  Take 1 tablet (600 mg total) by mouth every 6 (six) hours as needed for moderate pain.     prenatal multivitamin Tabs tablet  Take 1 tablet by mouth daily.     senna-docusate 8.6-50 MG tablet  Commonly known as:  Senokot-S  Take 2 tablets by mouth 2 (two) times daily.        Diet: routine diet  Activity: Advance as tolerated. Pelvic rest for 6 weeks.   Outpatient follow up:6 weeks Follow up Appt:No future appointments. Follow up Visit:No Follow-up on file.  Postpartum contraception: Not Discussed  Newborn Data: Live born female  Birth Weight: 7 lb 5.5 oz (3331 g) APGAR: 9, 9  Baby Feeding: Bottle Disposition:home with mother   10/03/2015 Myna Hidalgo, M, DO

## 2016-09-10 ENCOUNTER — Other Ambulatory Visit: Payer: Self-pay | Admitting: Obstetrics and Gynecology

## 2016-09-10 ENCOUNTER — Other Ambulatory Visit (HOSPITAL_COMMUNITY)
Admission: RE | Admit: 2016-09-10 | Discharge: 2016-09-10 | Disposition: A | Discharge status: Home / Self Care | Payer: BLUE CROSS/BLUE SHIELD | Source: Ambulatory Visit | Attending: Obstetrics and Gynecology | Admitting: Obstetrics and Gynecology

## 2016-09-10 DIAGNOSIS — Z1151 Encounter for screening for human papillomavirus (HPV): Secondary | ICD-10-CM | POA: Diagnosis present

## 2016-09-10 DIAGNOSIS — Z01411 Encounter for gynecological examination (general) (routine) with abnormal findings: Secondary | ICD-10-CM | POA: Diagnosis present

## 2016-09-14 LAB — CYTOLOGY - PAP
Diagnosis: NEGATIVE
HPV (WINDOPATH): NOT DETECTED

## 2016-11-04 ENCOUNTER — Ambulatory Visit
Admission: RE | Admit: 2016-11-04 | Discharge: 2016-11-04 | Disposition: A | Discharge status: Home / Self Care | Payer: BLUE CROSS/BLUE SHIELD | Source: Ambulatory Visit | Attending: Family Medicine | Admitting: Family Medicine

## 2016-11-04 ENCOUNTER — Other Ambulatory Visit: Payer: Self-pay | Admitting: Family Medicine

## 2016-11-04 DIAGNOSIS — R1084 Generalized abdominal pain: Secondary | ICD-10-CM

## 2017-04-22 IMAGING — CR DG ABDOMEN 2V
2 series · 2 of 2 positions shown · non-contrast
Comparison: None.

CLINICAL DATA: 30-year-old presenting with acute onset of
generalized abdominal pain associated with constipation.

EXAM:
ABDOMEN - 2 VIEW

[w abdomen upright]
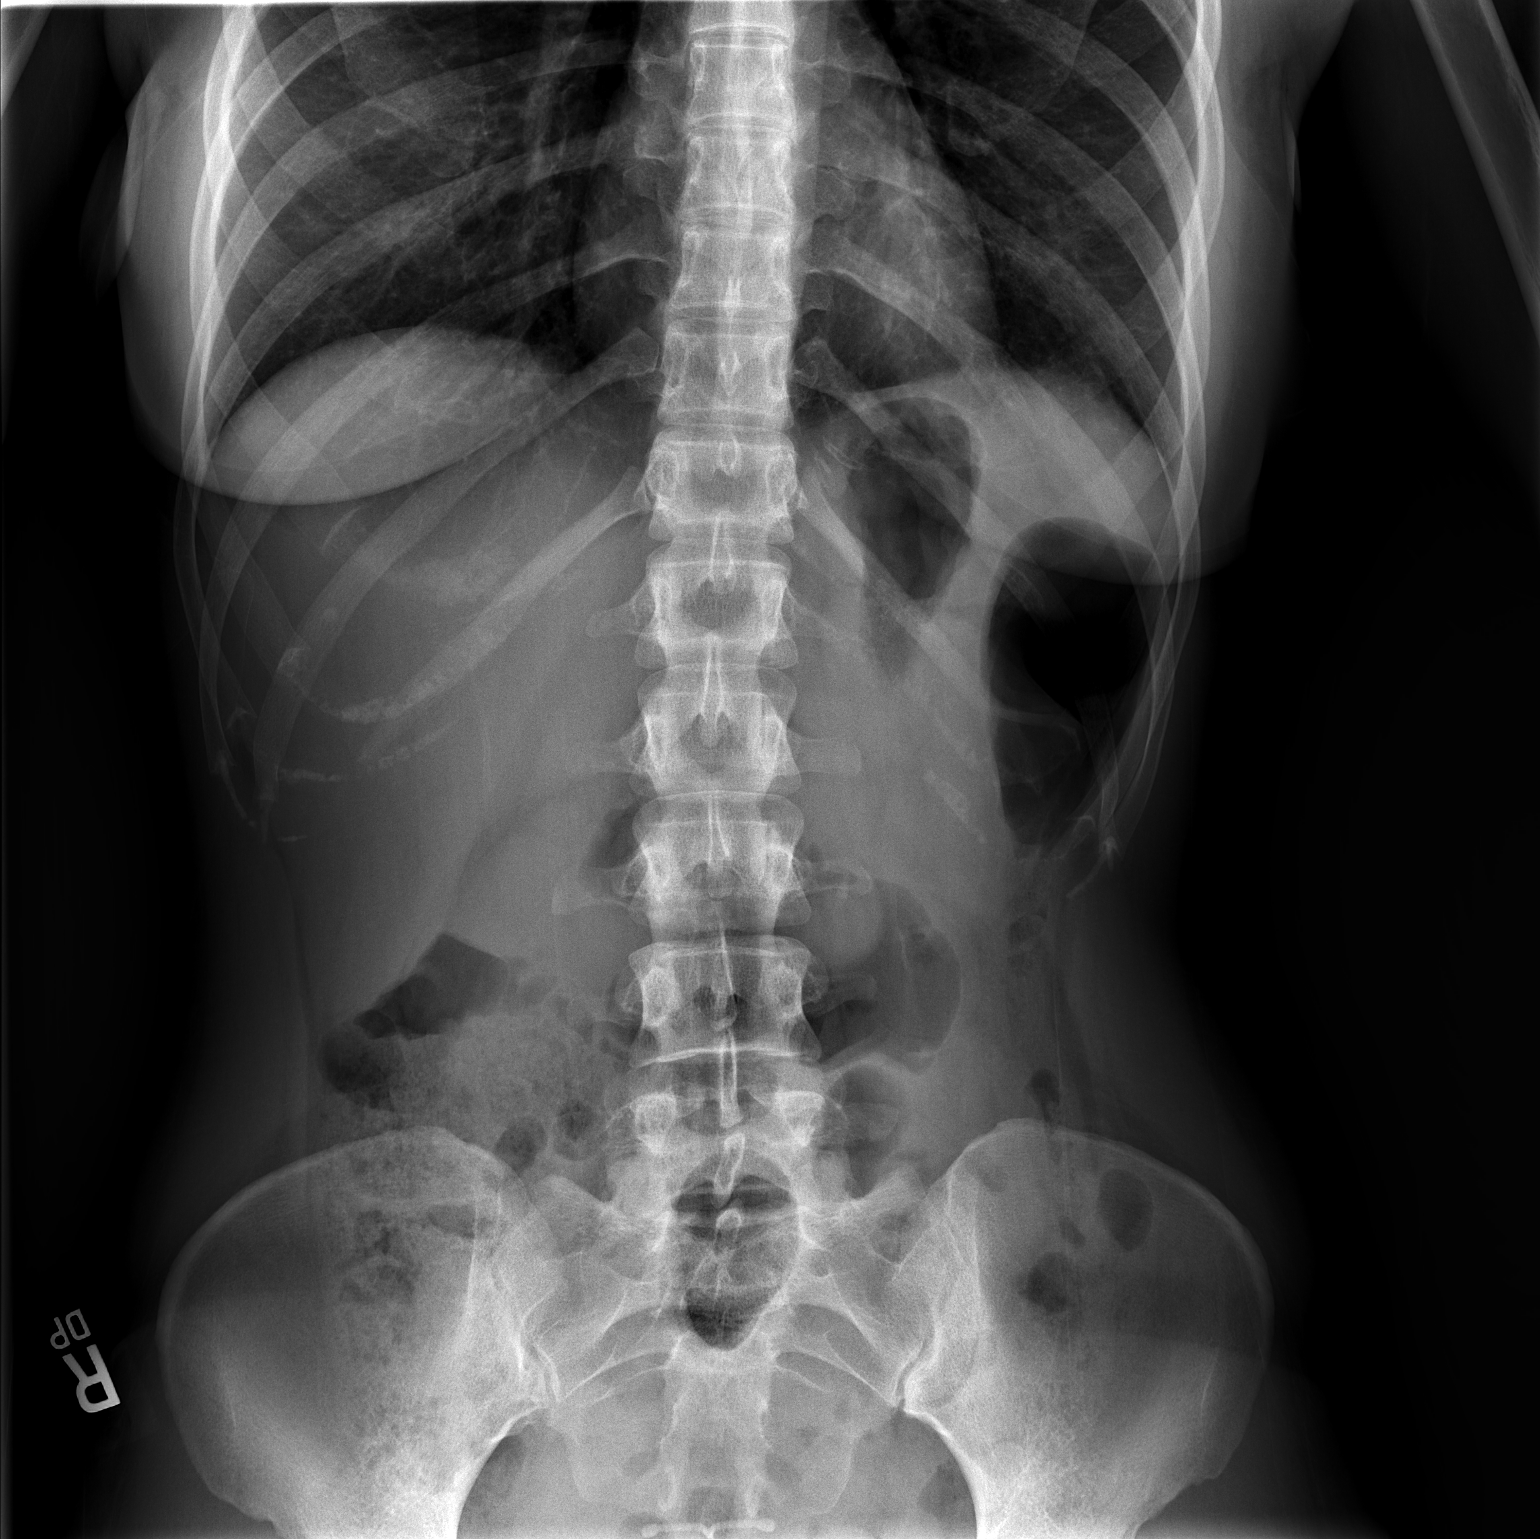

[t abdomen supine]
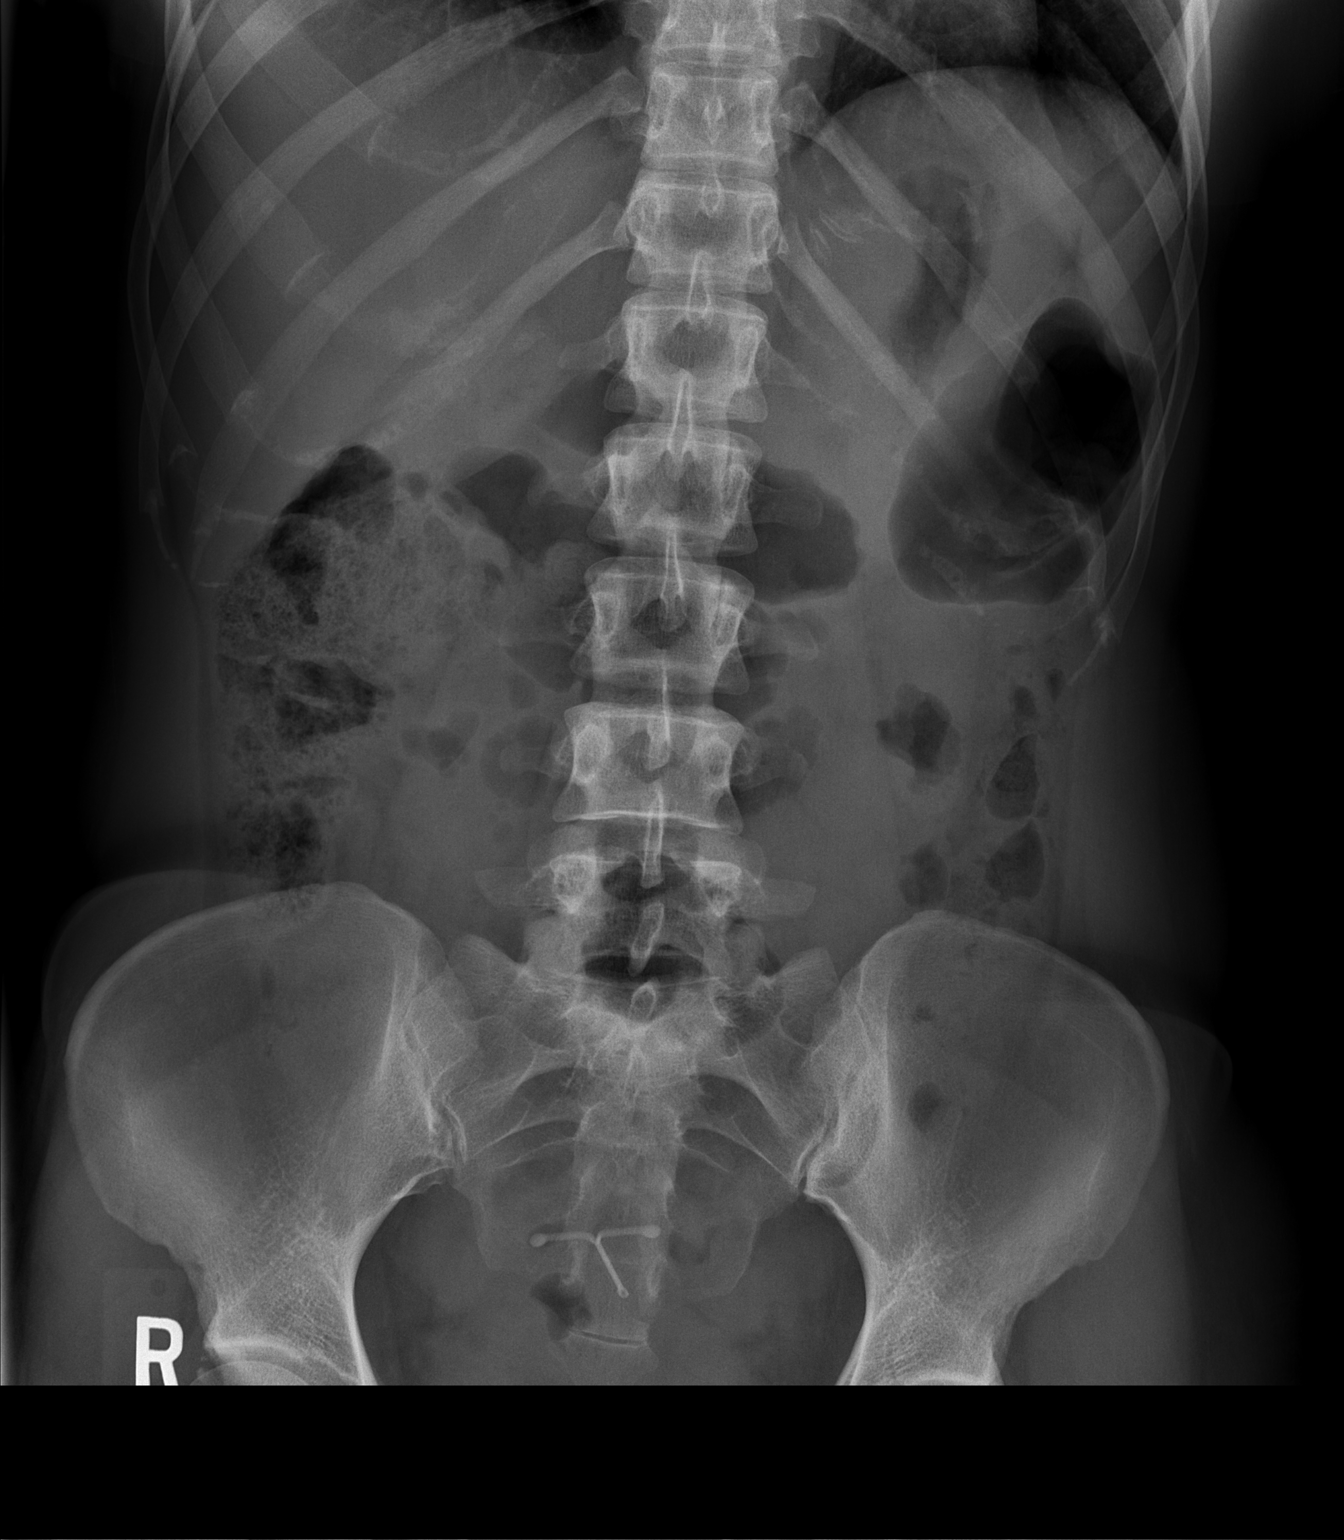

[2 of 2 positions shown; findings below may reference images not displayed]

FINDINGS: Bowel gas pattern unremarkable without evidence of obstruction or
significant ileus. No evidence of free air or significant air-fluid
levels on the erect image. Moderate stool burden in the colon.
Intrauterine device in the pelvic midline. No abnormal
calcifications. Regional skeleton intact.
IMPRESSION: No acute abdominal abnormality.  Moderate colonic stool burden.

## 2017-06-09 DIAGNOSIS — Z872 Personal history of diseases of the skin and subcutaneous tissue: Secondary | ICD-10-CM | POA: Diagnosis not present

## 2017-06-09 DIAGNOSIS — D225 Melanocytic nevi of trunk: Secondary | ICD-10-CM | POA: Diagnosis not present

## 2017-06-09 DIAGNOSIS — L820 Inflamed seborrheic keratosis: Secondary | ICD-10-CM | POA: Diagnosis not present

## 2017-06-09 DIAGNOSIS — L814 Other melanin hyperpigmentation: Secondary | ICD-10-CM | POA: Diagnosis not present

## 2017-06-09 DIAGNOSIS — L918 Other hypertrophic disorders of the skin: Secondary | ICD-10-CM | POA: Diagnosis not present

## 2017-09-15 DIAGNOSIS — Z01419 Encounter for gynecological examination (general) (routine) without abnormal findings: Secondary | ICD-10-CM | POA: Diagnosis not present

## 2017-10-03 DIAGNOSIS — N83201 Unspecified ovarian cyst, right side: Secondary | ICD-10-CM | POA: Diagnosis not present

## 2018-09-18 DIAGNOSIS — Z01419 Encounter for gynecological examination (general) (routine) without abnormal findings: Secondary | ICD-10-CM | POA: Diagnosis not present

## 2019-02-05 DIAGNOSIS — H5711 Ocular pain, right eye: Secondary | ICD-10-CM | POA: Diagnosis not present

## 2019-03-08 DIAGNOSIS — R05 Cough: Secondary | ICD-10-CM | POA: Diagnosis not present

## 2019-05-28 DIAGNOSIS — L989 Disorder of the skin and subcutaneous tissue, unspecified: Secondary | ICD-10-CM | POA: Diagnosis not present

## 2019-05-28 DIAGNOSIS — Z13220 Encounter for screening for lipoid disorders: Secondary | ICD-10-CM | POA: Diagnosis not present

## 2019-05-28 DIAGNOSIS — Z0000 Encounter for general adult medical examination without abnormal findings: Secondary | ICD-10-CM | POA: Diagnosis not present

## 2019-05-28 DIAGNOSIS — R946 Abnormal results of thyroid function studies: Secondary | ICD-10-CM | POA: Diagnosis not present

## 2019-07-06 DIAGNOSIS — D696 Thrombocytopenia, unspecified: Secondary | ICD-10-CM | POA: Diagnosis not present

## 2019-09-03 DIAGNOSIS — D2262 Melanocytic nevi of left upper limb, including shoulder: Secondary | ICD-10-CM | POA: Diagnosis not present

## 2019-09-03 DIAGNOSIS — D225 Melanocytic nevi of trunk: Secondary | ICD-10-CM | POA: Diagnosis not present

## 2019-09-03 DIAGNOSIS — D2261 Melanocytic nevi of right upper limb, including shoulder: Secondary | ICD-10-CM | POA: Diagnosis not present

## 2019-09-03 DIAGNOSIS — D2272 Melanocytic nevi of left lower limb, including hip: Secondary | ICD-10-CM | POA: Diagnosis not present

## 2019-10-05 ENCOUNTER — Other Ambulatory Visit: Payer: Self-pay | Admitting: Obstetrics and Gynecology

## 2019-10-05 ENCOUNTER — Other Ambulatory Visit (HOSPITAL_COMMUNITY)
Admission: RE | Admit: 2019-10-05 | Discharge: 2019-10-05 | Disposition: A | Discharge status: Home / Self Care | Payer: BC Managed Care – PPO | Source: Ambulatory Visit | Attending: Obstetrics and Gynecology | Admitting: Obstetrics and Gynecology

## 2019-10-05 DIAGNOSIS — Z01419 Encounter for gynecological examination (general) (routine) without abnormal findings: Secondary | ICD-10-CM | POA: Diagnosis not present

## 2019-10-10 LAB — CYTOLOGY - PAP
Comment: NEGATIVE
Diagnosis: NEGATIVE
High risk HPV: NEGATIVE

## 2019-11-01 DIAGNOSIS — M25532 Pain in left wrist: Secondary | ICD-10-CM | POA: Diagnosis not present

## 2019-11-01 DIAGNOSIS — M659 Synovitis and tenosynovitis, unspecified: Secondary | ICD-10-CM | POA: Diagnosis not present

## 2020-02-29 ENCOUNTER — Ambulatory Visit: Payer: BC Managed Care – PPO | Attending: Internal Medicine

## 2020-02-29 DIAGNOSIS — Z23 Encounter for immunization: Secondary | ICD-10-CM

## 2020-02-29 NOTE — Progress Notes (Signed)
   Covid-19 Vaccination Clinic  Name:  Alaila Pillard    MRN: [id number] DOB: March 26, 1986  02/29/2020  Ms. Raulerson was observed post Covid-19 immunization for 15 minutes without incident. She was provided with Vaccine Information Sheet and instruction to access the V-Safe system.   Ms. Boehne was instructed to call 911 with any severe reactions post vaccine: Marland Kitchen Difficulty breathing  . Swelling of face and throat  . A fast heartbeat  . A bad rash all over body  . Dizziness and weakness   Immunizations Administered    Name Date Dose VIS Date Route   Pfizer COVID-19 Vaccine 02/29/2020  3:46 PM 0.3 mL 11/09/2019 Intramuscular   Manufacturer: [employer], [employer]   Lot: [id number]   NDC: [address]-5

## 2020-03-24 ENCOUNTER — Ambulatory Visit: Payer: BC Managed Care – PPO | Attending: Internal Medicine

## 2020-03-24 DIAGNOSIS — Z23 Encounter for immunization: Secondary | ICD-10-CM

## 2020-03-24 NOTE — Progress Notes (Signed)
   Covid-19 Vaccination Clinic  Name:  Emma Coleman    MRN: [id number] DOB: [birth year]  03/24/2020  [name] was observed post Covid-19 immunization for 15 minutes without incident. She was provided with Vaccine Information Sheet and instruction to access the V-Safe system.   [name] was instructed to call 911 with any severe reactions post vaccine: Marland Kitchen Difficulty breathing  . Swelling of face and throat  . A fast heartbeat  . A bad rash all over body  . Dizziness and weakness   Immunizations Administered    Name Date Dose VIS Date Route   Pfizer COVID-19 Vaccine 03/24/2020  2:33 PM 0.3 mL 01/23/2019 Intramuscular   Manufacturer: [employer], [employer]   Lot: [id number]   NDC: [address]-3

## 2020-08-16 DIAGNOSIS — Z20828 Contact with and (suspected) exposure to other viral communicable diseases: Secondary | ICD-10-CM | POA: Diagnosis not present

## 2020-08-16 DIAGNOSIS — R0981 Nasal congestion: Secondary | ICD-10-CM | POA: Diagnosis not present

## 2020-08-16 DIAGNOSIS — J3489 Other specified disorders of nose and nasal sinuses: Secondary | ICD-10-CM | POA: Diagnosis not present

## 2020-09-02 DIAGNOSIS — N907 Vulvar cyst: Secondary | ICD-10-CM | POA: Diagnosis not present

## 2020-10-06 DIAGNOSIS — Z01419 Encounter for gynecological examination (general) (routine) without abnormal findings: Secondary | ICD-10-CM | POA: Diagnosis not present

## 2020-12-11 DIAGNOSIS — M7581 Other shoulder lesions, right shoulder: Secondary | ICD-10-CM | POA: Diagnosis not present

## 2020-12-11 DIAGNOSIS — M7521 Bicipital tendinitis, right shoulder: Secondary | ICD-10-CM | POA: Diagnosis not present

## 2020-12-11 DIAGNOSIS — M25511 Pain in right shoulder: Secondary | ICD-10-CM | POA: Diagnosis not present

## 2021-06-18 DIAGNOSIS — F411 Generalized anxiety disorder: Secondary | ICD-10-CM | POA: Diagnosis not present

## 2021-06-18 DIAGNOSIS — F4011 Social phobia, generalized: Secondary | ICD-10-CM | POA: Diagnosis not present

## 2021-06-25 DIAGNOSIS — F411 Generalized anxiety disorder: Secondary | ICD-10-CM | POA: Diagnosis not present

## 2021-06-25 DIAGNOSIS — F4011 Social phobia, generalized: Secondary | ICD-10-CM | POA: Diagnosis not present

## 2021-06-30 DIAGNOSIS — F4011 Social phobia, generalized: Secondary | ICD-10-CM | POA: Diagnosis not present

## 2021-06-30 DIAGNOSIS — F411 Generalized anxiety disorder: Secondary | ICD-10-CM | POA: Diagnosis not present

## 2021-07-02 DIAGNOSIS — F411 Generalized anxiety disorder: Secondary | ICD-10-CM | POA: Diagnosis not present

## 2021-07-09 DIAGNOSIS — F4011 Social phobia, generalized: Secondary | ICD-10-CM | POA: Diagnosis not present

## 2021-07-09 DIAGNOSIS — F411 Generalized anxiety disorder: Secondary | ICD-10-CM | POA: Diagnosis not present

## 2021-07-22 DIAGNOSIS — F4011 Social phobia, generalized: Secondary | ICD-10-CM | POA: Diagnosis not present

## 2021-07-22 DIAGNOSIS — F411 Generalized anxiety disorder: Secondary | ICD-10-CM | POA: Diagnosis not present

## 2021-07-30 DIAGNOSIS — F4011 Social phobia, generalized: Secondary | ICD-10-CM | POA: Diagnosis not present

## 2021-07-30 DIAGNOSIS — F411 Generalized anxiety disorder: Secondary | ICD-10-CM | POA: Diagnosis not present

## 2021-08-04 DIAGNOSIS — F411 Generalized anxiety disorder: Secondary | ICD-10-CM | POA: Diagnosis not present

## 2021-08-06 DIAGNOSIS — F411 Generalized anxiety disorder: Secondary | ICD-10-CM | POA: Diagnosis not present

## 2021-08-06 DIAGNOSIS — F4011 Social phobia, generalized: Secondary | ICD-10-CM | POA: Diagnosis not present

## 2021-08-13 DIAGNOSIS — F4011 Social phobia, generalized: Secondary | ICD-10-CM | POA: Diagnosis not present

## 2021-08-13 DIAGNOSIS — F411 Generalized anxiety disorder: Secondary | ICD-10-CM | POA: Diagnosis not present

## 2021-08-20 DIAGNOSIS — F4011 Social phobia, generalized: Secondary | ICD-10-CM | POA: Diagnosis not present

## 2021-08-20 DIAGNOSIS — F411 Generalized anxiety disorder: Secondary | ICD-10-CM | POA: Diagnosis not present

## 2021-09-01 DIAGNOSIS — F411 Generalized anxiety disorder: Secondary | ICD-10-CM | POA: Diagnosis not present

## 2021-09-03 DIAGNOSIS — F4011 Social phobia, generalized: Secondary | ICD-10-CM | POA: Diagnosis not present

## 2021-09-03 DIAGNOSIS — F411 Generalized anxiety disorder: Secondary | ICD-10-CM | POA: Diagnosis not present

## 2021-09-10 DIAGNOSIS — F4011 Social phobia, generalized: Secondary | ICD-10-CM | POA: Diagnosis not present

## 2021-09-10 DIAGNOSIS — F411 Generalized anxiety disorder: Secondary | ICD-10-CM | POA: Diagnosis not present

## 2021-09-29 DIAGNOSIS — F411 Generalized anxiety disorder: Secondary | ICD-10-CM | POA: Diagnosis not present

## 2021-09-29 DIAGNOSIS — F4011 Social phobia, generalized: Secondary | ICD-10-CM | POA: Diagnosis not present

## 2021-10-15 DIAGNOSIS — Z01419 Encounter for gynecological examination (general) (routine) without abnormal findings: Secondary | ICD-10-CM | POA: Diagnosis not present

## 2021-10-15 DIAGNOSIS — Z13220 Encounter for screening for lipoid disorders: Secondary | ICD-10-CM | POA: Diagnosis not present

## 2021-10-19 DIAGNOSIS — Z01419 Encounter for gynecological examination (general) (routine) without abnormal findings: Secondary | ICD-10-CM | POA: Diagnosis not present

## 2021-10-20 DIAGNOSIS — F4011 Social phobia, generalized: Secondary | ICD-10-CM | POA: Diagnosis not present

## 2021-10-20 DIAGNOSIS — F411 Generalized anxiety disorder: Secondary | ICD-10-CM | POA: Diagnosis not present

## 2021-10-28 DIAGNOSIS — Z113 Encounter for screening for infections with a predominantly sexual mode of transmission: Secondary | ICD-10-CM | POA: Diagnosis not present

## 2021-10-28 DIAGNOSIS — B009 Herpesviral infection, unspecified: Secondary | ICD-10-CM | POA: Diagnosis not present

## 2021-11-10 DIAGNOSIS — F4011 Social phobia, generalized: Secondary | ICD-10-CM | POA: Diagnosis not present

## 2021-11-10 DIAGNOSIS — F411 Generalized anxiety disorder: Secondary | ICD-10-CM | POA: Diagnosis not present

## 2021-11-19 DIAGNOSIS — B009 Herpesviral infection, unspecified: Secondary | ICD-10-CM | POA: Diagnosis not present

## 2022-02-02 DIAGNOSIS — R519 Headache, unspecified: Secondary | ICD-10-CM | POA: Diagnosis not present

## 2022-02-02 DIAGNOSIS — M791 Myalgia, unspecified site: Secondary | ICD-10-CM | POA: Diagnosis not present

## 2022-02-02 DIAGNOSIS — Z20822 Contact with and (suspected) exposure to covid-19: Secondary | ICD-10-CM | POA: Diagnosis not present

## 2022-02-02 DIAGNOSIS — J029 Acute pharyngitis, unspecified: Secondary | ICD-10-CM | POA: Diagnosis not present

## 2022-03-18 DIAGNOSIS — M7581 Other shoulder lesions, right shoulder: Secondary | ICD-10-CM | POA: Diagnosis not present

## 2022-03-18 DIAGNOSIS — M7521 Bicipital tendinitis, right shoulder: Secondary | ICD-10-CM | POA: Diagnosis not present

## 2022-03-18 DIAGNOSIS — M25511 Pain in right shoulder: Secondary | ICD-10-CM | POA: Diagnosis not present

## 2022-03-29 DIAGNOSIS — X32XXXS Exposure to sunlight, sequela: Secondary | ICD-10-CM | POA: Diagnosis not present

## 2022-03-29 DIAGNOSIS — D229 Melanocytic nevi, unspecified: Secondary | ICD-10-CM | POA: Diagnosis not present

## 2022-03-29 DIAGNOSIS — D1801 Hemangioma of skin and subcutaneous tissue: Secondary | ICD-10-CM | POA: Diagnosis not present

## 2022-03-29 DIAGNOSIS — L814 Other melanin hyperpigmentation: Secondary | ICD-10-CM | POA: Diagnosis not present

## 2022-04-23 DIAGNOSIS — E782 Mixed hyperlipidemia: Secondary | ICD-10-CM | POA: Diagnosis not present

## 2022-04-23 DIAGNOSIS — Z0000 Encounter for general adult medical examination without abnormal findings: Secondary | ICD-10-CM | POA: Diagnosis not present

## 2022-04-23 DIAGNOSIS — F329 Major depressive disorder, single episode, unspecified: Secondary | ICD-10-CM | POA: Diagnosis not present

## 2022-04-23 DIAGNOSIS — F411 Generalized anxiety disorder: Secondary | ICD-10-CM | POA: Diagnosis not present

## 2022-06-02 DIAGNOSIS — J019 Acute sinusitis, unspecified: Secondary | ICD-10-CM | POA: Diagnosis not present

## 2022-10-18 DIAGNOSIS — Z01419 Encounter for gynecological examination (general) (routine) without abnormal findings: Secondary | ICD-10-CM | POA: Diagnosis not present

## 2023-01-31 DIAGNOSIS — R635 Abnormal weight gain: Secondary | ICD-10-CM | POA: Diagnosis not present

## 2023-01-31 DIAGNOSIS — R42 Dizziness and giddiness: Secondary | ICD-10-CM | POA: Diagnosis not present

## 2023-01-31 DIAGNOSIS — R002 Palpitations: Secondary | ICD-10-CM | POA: Diagnosis not present

## 2023-02-07 DIAGNOSIS — R42 Dizziness and giddiness: Secondary | ICD-10-CM | POA: Diagnosis not present

## 2023-02-07 DIAGNOSIS — R635 Abnormal weight gain: Secondary | ICD-10-CM | POA: Diagnosis not present

## 2023-02-07 DIAGNOSIS — R002 Palpitations: Secondary | ICD-10-CM | POA: Diagnosis not present

## 2023-05-02 ENCOUNTER — Other Ambulatory Visit (HOSPITAL_BASED_OUTPATIENT_CLINIC_OR_DEPARTMENT_OTHER): Payer: Self-pay | Admitting: Physician Assistant

## 2023-05-02 ENCOUNTER — Other Ambulatory Visit: Payer: Self-pay | Admitting: Physician Assistant

## 2023-05-02 DIAGNOSIS — R0609 Other forms of dyspnea: Secondary | ICD-10-CM | POA: Diagnosis not present

## 2023-05-02 DIAGNOSIS — E782 Mixed hyperlipidemia: Secondary | ICD-10-CM | POA: Diagnosis not present

## 2023-05-02 DIAGNOSIS — R0989 Other specified symptoms and signs involving the circulatory and respiratory systems: Secondary | ICD-10-CM

## 2023-05-02 DIAGNOSIS — Z0000 Encounter for general adult medical examination without abnormal findings: Secondary | ICD-10-CM | POA: Diagnosis not present

## 2023-05-17 ENCOUNTER — Other Ambulatory Visit (HOSPITAL_BASED_OUTPATIENT_CLINIC_OR_DEPARTMENT_OTHER): Payer: BC Managed Care – PPO

## 2023-05-17 ENCOUNTER — Ambulatory Visit (HOSPITAL_BASED_OUTPATIENT_CLINIC_OR_DEPARTMENT_OTHER): Payer: BC Managed Care – PPO

## 2023-05-17 ENCOUNTER — Ambulatory Visit (HOSPITAL_BASED_OUTPATIENT_CLINIC_OR_DEPARTMENT_OTHER)
Admission: RE | Admit: 2023-05-17 | Discharge: 2023-05-17 | Disposition: A | Discharge status: Home / Self Care | Payer: BC Managed Care – PPO | Source: Ambulatory Visit | Attending: Physician Assistant | Admitting: Physician Assistant

## 2023-05-17 DIAGNOSIS — E782 Mixed hyperlipidemia: Secondary | ICD-10-CM | POA: Insufficient documentation

## 2023-05-19 DIAGNOSIS — D649 Anemia, unspecified: Secondary | ICD-10-CM | POA: Diagnosis not present

## 2023-05-30 ENCOUNTER — Other Ambulatory Visit: Payer: Self-pay | Admitting: Family Medicine

## 2023-05-30 DIAGNOSIS — R0989 Other specified symptoms and signs involving the circulatory and respiratory systems: Secondary | ICD-10-CM

## 2023-06-10 ENCOUNTER — Encounter: Payer: Self-pay | Admitting: Family Medicine

## 2023-06-27 ENCOUNTER — Encounter: Payer: Self-pay | Admitting: Family Medicine

## 2023-06-27 DIAGNOSIS — L509 Urticaria, unspecified: Secondary | ICD-10-CM | POA: Diagnosis not present

## 2023-07-08 ENCOUNTER — Other Ambulatory Visit: Payer: Self-pay | Admitting: Physician Assistant

## 2023-07-08 DIAGNOSIS — R0989 Other specified symptoms and signs involving the circulatory and respiratory systems: Secondary | ICD-10-CM

## 2023-07-12 DIAGNOSIS — L509 Urticaria, unspecified: Secondary | ICD-10-CM | POA: Diagnosis not present

## 2023-07-18 ENCOUNTER — Other Ambulatory Visit: Payer: BC Managed Care – PPO

## 2023-08-03 ENCOUNTER — Ambulatory Visit
Admission: RE | Admit: 2023-08-03 | Discharge: 2023-08-03 | Disposition: A | Discharge status: Home / Self Care | Payer: BC Managed Care – PPO | Source: Ambulatory Visit | Attending: Physician Assistant | Admitting: Physician Assistant

## 2023-08-03 DIAGNOSIS — R0989 Other specified symptoms and signs involving the circulatory and respiratory systems: Secondary | ICD-10-CM

## 2023-08-22 ENCOUNTER — Ambulatory Visit: Payer: BC Managed Care – PPO | Admitting: Internal Medicine

## 2023-08-22 ENCOUNTER — Encounter: Payer: Self-pay | Admitting: Internal Medicine

## 2023-08-22 VITALS — BP 118/76 | HR 64 | Temp 97.7°F | Resp 20 | Ht 64.45 in | Wt 149.0 lb

## 2023-08-22 DIAGNOSIS — L501 Idiopathic urticaria: Secondary | ICD-10-CM

## 2023-08-22 NOTE — Patient Instructions (Signed)
Chronic Idiopathic Urticaria: - this is defined as hives lasting more than 6 weeks without an identifiable trigger - hives can be from a number of different sources including infections, allergies, vibration, temperature, pressure among many others other possible causes - often an identifiable cause is not determined - some potential triggers include: stress, illness, NSAIDs, aspirin, hormonal changes - you do not have any red flag symptoms to make [place] concerned about secondary causes of hives, but we will screen for these for reassurance with:     tryptase, hive panel, alpha-gal panel, environmental allergy testing  - approximately 50% of patients with chronic hives can have some associated swelling of the face/lips/eyelids (this is not a cause for alarm and does not typically progress onto systemic allergic reactions)  Therapy Plan:  - start zyrtec (cetirizine) 10mg  once  twice daily  - if hives are uncontrolled, increase zyrtec (cetirizine) to 20mg  twice daily (can buy cheaply on amazon or from [employer], generic is okay)  - this is maximum dose - can increase or decrease dosing depending on symptom control to a maximum dose of 4 tablets of antihistamine daily. Wait until hives free for at least one month prior to decreasing dose.   - if hives are still uncontrolled with the above regimen, please arrange an appointment for discussion of Xolair (omalizumab)- an injectable medication for hives  Can use one of the following in place of zyrtec if desires: Claritin (loratadine) 10 mg, Xyzal (levocetirizine) 5 mg or Allegra (fexofenadine) 180 mg daily as needed  Follow up: 2 months   Thank you so much for letting me partake in your care today.  Don't hesitate to reach out if you have any additional concerns!  Ferol Luz, MD  [facility]- [place], [place]

## 2023-08-22 NOTE — Progress Notes (Signed)
New Patient Note  RE: Emma Coleman MRN: [id number] DOB: 07-Apr-1986 Date of Office Visit: 08/22/2023  Consult requested by: Alyson Ingles, PA* Primary care provider: Alyson Ingles, PA-C  Chief Complaint: Urticaria (Pt states she started breaking out into random hives episodes that are super itchy about 3 months ago and she's been taking benadryl for flare ups. It appeears everywhere on her body and at different locations and times.  She had some labs done at [facility] physicians.)  History of Present Illness: I had the pleasure of seeing Emma Coleman for initial evaluation at the [facility] on 08/22/2023. She is a 37 y.o. female, who is referred here by Alyson Ingles, PA-C for the evaluation of urticaria .  History obtained from patient, chart review.  Rash: started in June 2024, occurring daily  Associates dermatographism  Denies other systemic symptoms including no respiratory, gastrointestinal or cardiovascular distress. Denies  changes to personal care products, detergents, diet, etc Therapies tried: benadryl 50mg  daily, prednisone 2 courses  Potential triggers: friction,  Does not associate fever, joint pain, joint swelling, weight loss.  Lesions resolve in within 72 hours   No bruising on resolution. No  recent illness. Pictures reviewed consistent with hives and dermatographism   PCP did labs in August: CBC, CMP- normal, TSH, ANA, RF - normal;  ESR 42    Assessment and Plan: Emma Coleman is a 37 y.o. female with: Idiopathic urticaria - Plan: Alpha-Gal Panel, Tryptase, Chronic Urticaria, Allergens, Zone 2   Plan: Patient Instructions  Chronic Idiopathic Urticaria: - this is defined as hives lasting more than 6 weeks without an identifiable trigger - hives can be from a number of different sources including infections, allergies, vibration, temperature, pressure among many others other possible causes - often an identifiable cause is not  determined - some potential triggers include: stress, illness, NSAIDs, aspirin, hormonal changes - you do not have any red flag symptoms to make [place] concerned about secondary causes of hives, but we will screen for these for reassurance with:     tryptase, hive panel, alpha-gal panel, environmental allergy testing  - approximately 50% of patients with chronic hives can have some associated swelling of the face/lips/eyelids (this is not a cause for alarm and does not typically progress onto systemic allergic reactions)  Therapy Plan:  - start zyrtec (cetirizine) 10mg  once  twice daily  - if hives are uncontrolled, increase zyrtec (cetirizine) to 20mg  twice daily (can buy cheaply on amazon or from [employer], generic is okay)  - this is maximum dose - can increase or decrease dosing depending on symptom control to a maximum dose of 4 tablets of antihistamine daily. Wait until hives free for at least one month prior to decreasing dose.   - if hives are still uncontrolled with the above regimen, please arrange an appointment for discussion of Xolair (omalizumab)- an injectable medication for hives  Can use one of the following in place of zyrtec if desires: Claritin (loratadine) 10 mg, Xyzal (levocetirizine) 5 mg or Allegra (fexofenadine) 180 mg daily as needed  Follow up: 2 months   Thank you so much for letting me partake in your care today.  Don't hesitate to reach out if you have any additional concerns!  Ferol Luz, MD  [facility] [place] [place]      No orders of the defined types were placed in this encounter.  Lab Orders         Alpha-Gal  Panel         Tryptase         Chronic Urticaria         Allergens, Zone 2      Other allergy screening: Asthma: no Rhino conjunctivitis: no Food allergy: no Medication allergy: no Hymenoptera allergy: no Urticaria: yes Eczema:no History of recurrent infections suggestive of immunodeficency: no  Diagnostics:  Skin  Testing: Deferred due to recent antihistamines use.    Past Medical History: Patient Active Problem List   Diagnosis Date Noted   Normal labor 09/25/2015   Past Medical History:  Diagnosis Date   Depression    History of chicken pox    Past Surgical History: Past Surgical History:  Procedure Laterality Date   BREAST SURGERY     implants   Medication List:  Current Outpatient Medications  Medication Sig Dispense Refill   calcium carbonate (TUMS - DOSED IN MG ELEMENTAL CALCIUM) 500 MG chewable tablet Chew 2 tablets by mouth daily as needed for indigestion or heartburn. heartburn     escitalopram (LEXAPRO) 10 MG tablet Take by mouth.     ibuprofen (ADVIL,MOTRIN) 600 MG tablet Take 1 tablet (600 mg total) by mouth every 6 (six) hours as needed for moderate pain. 30 tablet 0   levonorgestrel (MIRENA, 52 MG,) 20 MCG/DAY IUD by Intrauterine route.     Prenatal Vit-Fe Fumarate-FA (PRENATAL MULTIVITAMIN) TABS Take 1 tablet by mouth daily.     valACYclovir (VALTREX) 500 MG tablet Take 500 mg by mouth daily.     albuterol (VENTOLIN HFA) 108 (90 Base) MCG/ACT inhaler SMARTSIG:1 Puff(s) By Mouth Every 4 Hours PRN (Patient not taking: Reported on 08/22/2023)     senna-docusate (SENOKOT-S) 8.6-50 MG tablet Take 2 tablets by mouth 2 (two) times daily. (Patient not taking: Reported on 08/22/2023) 60 tablet 2   No current facility-administered medications for this visit.   Allergies: No Known Allergies Social History: Social History   Socioeconomic History   Marital status: Married    Spouse name: Not on file   Number of children: Not on file   Years of education: Not on file   Highest education level: Not on file  Occupational History   Not on file  Tobacco Use   Smoking status: Never   Smokeless tobacco: Never  Substance and Sexual Activity   Alcohol use: No   Drug use: No   Sexual activity: Yes  Other Topics Concern   Not on file  Social History Narrative   Not on file    Social Determinants of Health   Financial Resource Strain: Not on file  Food Insecurity: Not on file  Transportation Needs: Not on file  Physical Activity: Not on file  Stress: Not on file  Social Connections: Not on file   Lives in a [facility] that is [address] years old, no roaches in the house and bed is 2 feet off the floor. Not exposed to fumes, chemicals or dust, no HEPA filter in the home and home is not near an interstate or industrial area . 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 HistorySurveyor, minerals in the house: no Engineer, civil (consulting) in the family room: no Carpet in the bedroom: no Heating: electric Cooling: central Pet: yes 2 cats with access to bedroom   Family History: Family History  Problem Relation Age of Onset   Hyperlipidemia Mother    Depression Mother    Hypertension Mother    Cancer Father  multiple myeloma   Hyperlipidemia Father    Depression Father    Hypertension Father    Heart attack Father    ALS Maternal Uncle    Hyperlipidemia Maternal Grandmother    Heart attack Maternal Grandfather    Cancer Paternal Grandmother        breast   Cancer Paternal Grandfather        prostate     ROS: All others negative except as noted per HPI.   Objective: BP 118/76   Pulse 64   Temp 97.7 F (36.5 C) (Temporal)   Resp 20   Ht 5' 4.45" (1.637 m)   Wt 149 lb (67.6 kg)   SpO2 98%   BMI 25.22 kg/m  Body mass index is 25.22 kg/m.  General Appearance:  Alert, cooperative, no distress, appears stated age  Head:  Normocephalic, without obvious abnormality, atraumatic  Eyes:  Conjunctiva clear, EOM's intact  Nose: Nares normal, normal mucosa, no visible anterior polyps, and septum midline  Throat: Lips, tongue normal; teeth and gums normal, normal posterior oropharynx  Neck: Supple, symmetrical  Lungs:   clear to auscultation bilaterally, Respirations unlabored, no coughing  Heart:  regular rate and rhythm and no  murmur, Appears well perfused  Extremities: No edema  Skin: Skin color, texture, turgor normal,  erythema on face and arms   Neurologic: No gross deficits   The plan was reviewed with the patient/family, and all questions/concerned were addressed.  It was my pleasure to see Emma Coleman today and participate in her care. Please feel free to contact me with any questions or concerns.  Sincerely,  Ferol Luz, MD Allergy & Immunology  [facility] office: [phone] [facility] office: [phone]

## 2023-08-24 LAB — ALLERGENS, ZONE 2
Bermuda Grass IgE: 0.1 kU/L — AB
Cat Dander IgE: 1.99 kU/L — AB
Dog Dander IgE: 0.68 kU/L — AB
Timothy Grass IgE: 0.25 kU/L — AB

## 2023-08-24 LAB — CHRONIC URTICARIA

## 2023-08-24 LAB — ALPHA-GAL PANEL: IgE (Immunoglobulin E), Serum: 32 IU/mL (ref 6–495)

## 2023-08-24 LAB — TRYPTASE: Tryptase: 15.9 ug/L — ABNORMAL HIGH (ref 2.2–13.2)

## 2023-09-13 ENCOUNTER — Telehealth: Payer: Self-pay | Admitting: Internal Medicine

## 2023-09-13 NOTE — Telephone Encounter (Signed)
Pt informed

## 2023-09-13 NOTE — Progress Notes (Signed)
I reviewed the bloodwork. Blood count, kidney function, liver function, electrolytes, thyroid, autoimmune screener, inflammation markers, chronic urticaria index (checks for autoantibodies that trigger mast cells),and alpha gal (checks for red meat allergy) were all normal which is great.   tryptase (checks for mast cell issues) was elevated.  This will need to be repeated and we can do this at follow up.  Allergy testing was positive to cat, dog and grass.    Thanks!

## 2023-09-13 NOTE — Telephone Encounter (Signed)
Left another message for pt to call me back.

## 2023-09-13 NOTE — Telephone Encounter (Signed)
Pt returning Carrie's Call about lab results

## 2023-09-14 ENCOUNTER — Other Ambulatory Visit: Payer: Self-pay

## 2023-09-14 DIAGNOSIS — L501 Idiopathic urticaria: Secondary | ICD-10-CM

## 2023-09-16 LAB — TRYPTASE: Tryptase: 15.3 ug/L — ABNORMAL HIGH (ref 2.2–13.2)

## 2023-09-26 NOTE — Progress Notes (Signed)
Repeat tryptase remains elevated.  She does not meet cut off for evaluation of systemic mastocytosis, but should be evaluated for a hereditary condition called hereditary alpha typtasemia.  This is a cash based genetic test that costs $162.00.  If she is interested we can arrange the testing at our [facility] office.  Can someone let patient know?  Thanks!

## 2023-10-18 ENCOUNTER — Encounter: Payer: Self-pay | Admitting: Internal Medicine

## 2023-10-18 ENCOUNTER — Ambulatory Visit: Payer: BC Managed Care – PPO | Admitting: Internal Medicine

## 2023-10-18 ENCOUNTER — Other Ambulatory Visit: Payer: Self-pay

## 2023-10-18 VITALS — BP 116/70 | HR 71 | Temp 98.1°F | Resp 20 | Wt 155.4 lb

## 2023-10-18 DIAGNOSIS — L501 Idiopathic urticaria: Secondary | ICD-10-CM | POA: Diagnosis not present

## 2023-10-18 DIAGNOSIS — R748 Abnormal levels of other serum enzymes: Secondary | ICD-10-CM | POA: Diagnosis not present

## 2023-10-18 NOTE — Progress Notes (Signed)
FOLLOW UP Date of Service/Encounter:  10/18/23  Subjective:  Emma Coleman (DOB: May 06, 1986) is a 37 y.o. female who [facility] in re-evaluation of the following: urticaria  History obtained from: chart review and patient.  For Review, LV was on 08/22/2023 with Dr. Marlynn Perking seen for intial visit for urticaria . See below for summary of history and diagnostics.   Therapeutic plans/changes recommended: Labs obtained, elevated tryptase which was repeated 1 month later remained elevated.  Turns her to 10 mg twice daily ----------------------------------------------------- Pertinent History/Diagnostics:   Urticaria:  - labs (08/22/23): Normal except for tryptase 15.3-15.9; specific IgE positive to cat, dog, grass -onset June 2024, daily associated dermographism; resolved with Zyrtec 10 mg twice daily --------------------------------------------------- Today presents for follow-up. Discussed the use of AI scribe software for clinical note transcription with the patient, who gave verbal consent to proceed.  History of Present Illness   The patient, with a history of chronic hives, reports significant improvement in symptoms with twice daily Zyrtec. She has not experienced any itching or development of bumps. However, she occasionally notices red lines on her skin, suggesting some residual dermatographism. She denies any side effects from Zyrtec.  The patient's tryptase levels remain elevated, indicating a potential condition of hereditary alpha tryptosemia. However, she is currently asymptomatic and has opted not to pursue genetic testing at this time.  The patient is aware of the potential long-term effects of Zyrtec use and is comfortable with the current management plan.         All medications reviewed by clinical staff and updated in chart. No new pertinent medical or surgical history except as noted in HPI.  ROS: All others negative  except as noted per HPI.   Objective:  BP 116/70 (BP Location: Left Arm, Patient Position: Sitting, Cuff Size: Normal)   Pulse 71   Temp 98.1 F (36.7 C) (Temporal)   Resp 20   Wt 155 lb 6.4 oz (70.5 kg)   SpO2 98%   BMI 26.30 kg/m  Body mass index is 26.3 kg/m. Physical Exam: General Appearance:  Alert, cooperative, no distress, appears stated age  Head:  Normocephalic, without obvious abnormality, atraumatic  Eyes:  Conjunctiva clear, EOM's intact  Ears EACs normal bilaterally  Nose: Nares normal,     Throat: Lips, tongue normal; teeth and gums normal,     Neck: Supple, symmetrical  Lungs:     , Respirations unlabored, no coughing  Heart:    , Appears well perfused  Extremities: No edema  Skin: Skin color, texture, turgor normal and no rashes or lesions on visualized portions of skin  Neurologic: No gross deficits   Labs:  Lab Orders  No laboratory test(s) ordered today      Assessment/Plan   Chronic Idiopathic Urticaria:  Elevated tryptase: 15-3.15.9.  This could be a sign of hereditary alpha tryptassemia.  I do not think we need to test for this genetic condition at this time, but could consider if symptoms worsen early developing more severe reaction such as anaphylaxis  Therapy Plan:  -Try decreasing zyrtec (cetirizine) 10mg  once  daily   -If you go hive and itch free completely for about 4 to 6 weeks you can stop antihistamines, completely  -If symptoms worsen go back to twice a day Zyrtec.  Titrate up to 4 times a day as needed to control symptoms   - if hives are still uncontrolled with the above regimen, please arrange an appointment for  discussion of Xolair (omalizumab)- an injectable medication for hives  Can use one of the following in place of zyrtec if desires: Claritin (loratadine) 10 mg, Xyzal (levocetirizine) 5 mg or Allegra (fexofenadine) 180 mg daily as needed  Follow up:6 months   Other:     Thank you so much for letting me partake in your care  today.  Don't hesitate to reach out if you have any additional concerns!  Ferol Luz, MD  [facility] [place], [place]

## 2023-10-18 NOTE — Patient Instructions (Signed)
Chronic Idiopathic Urticaria:  Elevated tryptase: 15-3.15.9.  This could be a sign of hereditary alpha tryptassemia.  I do not think we need to test for this genetic condition at this time, but could consider if symptoms worsen early developing more severe reaction such as anaphylaxis  Therapy Plan:  -Try decreasing zyrtec (cetirizine) 10mg  once  daily   -If you go hive and itch free completely for about 4 to 6 weeks you can stop antihistamines, completely  -If symptoms worsen go back to twice a day Zyrtec.  Titrate up to 4 times a day as needed to control symptoms   - if hives are still uncontrolled with the above regimen, please arrange an appointment for discussion of Xolair (omalizumab)- an injectable medication for hives  Can use one of the following in place of zyrtec if desires: Claritin (loratadine) 10 mg, Xyzal (levocetirizine) 5 mg or Allegra (fexofenadine) 180 mg daily as needed  Follow up:6 months   Thank you so much for letting me partake in your care today.  Don't hesitate to reach out if you have any additional concerns!  Ferol Luz, MD  [facility] [place], [place]

## 2023-10-24 DIAGNOSIS — Z01419 Encounter for gynecological examination (general) (routine) without abnormal findings: Secondary | ICD-10-CM | POA: Diagnosis not present

## 2023-11-10 DIAGNOSIS — Z3202 Encounter for pregnancy test, result negative: Secondary | ICD-10-CM | POA: Diagnosis not present

## 2023-11-10 DIAGNOSIS — Z30433 Encounter for removal and reinsertion of intrauterine contraceptive device: Secondary | ICD-10-CM | POA: Diagnosis not present

## 2024-04-16 ENCOUNTER — Ambulatory Visit: Payer: BC Managed Care – PPO | Admitting: Internal Medicine

## 2024-04-16 DIAGNOSIS — J309 Allergic rhinitis, unspecified: Secondary | ICD-10-CM

## 2024-05-07 DIAGNOSIS — Z13220 Encounter for screening for lipoid disorders: Secondary | ICD-10-CM | POA: Diagnosis not present

## 2024-05-07 DIAGNOSIS — Z0000 Encounter for general adult medical examination without abnormal findings: Secondary | ICD-10-CM | POA: Diagnosis not present

## 2024-11-28 ENCOUNTER — Other Ambulatory Visit: Payer: Self-pay | Admitting: Nurse Practitioner

## 2024-11-28 DIAGNOSIS — R102 Pelvic and perineal pain unspecified side: Secondary | ICD-10-CM

## 2024-11-28 DIAGNOSIS — N852 Hypertrophy of uterus: Secondary | ICD-10-CM

## 2024-12-13 ENCOUNTER — Ambulatory Visit
Admission: RE | Admit: 2024-12-13 | Discharge: 2024-12-13 | Disposition: A | Source: Ambulatory Visit | Attending: Nurse Practitioner | Admitting: Nurse Practitioner

## 2024-12-13 DIAGNOSIS — R102 Pelvic and perineal pain unspecified side: Secondary | ICD-10-CM

## 2024-12-13 DIAGNOSIS — N852 Hypertrophy of uterus: Secondary | ICD-10-CM
